# Patient Record
Sex: Male | Born: 1960 | Race: White | Hispanic: No | Marital: Married | Smoking: Former smoker
Health system: Southern US, Community
[De-identification: ages and names within clinical notes are randomized; demographics above are authoritative.]

## PROBLEM LIST (undated history)

## (undated) DIAGNOSIS — L409 Psoriasis, unspecified: Secondary | ICD-10-CM

## (undated) DIAGNOSIS — M479 Spondylosis, unspecified: Secondary | ICD-10-CM

## (undated) DIAGNOSIS — I10 Essential (primary) hypertension: Secondary | ICD-10-CM

## (undated) DIAGNOSIS — M4326 Fusion of spine, lumbar region: Secondary | ICD-10-CM

## (undated) DIAGNOSIS — R7303 Prediabetes: Secondary | ICD-10-CM

## (undated) DIAGNOSIS — L0390 Cellulitis, unspecified: Secondary | ICD-10-CM

## (undated) DIAGNOSIS — G4730 Sleep apnea, unspecified: Secondary | ICD-10-CM

## (undated) DIAGNOSIS — K589 Irritable bowel syndrome without diarrhea: Secondary | ICD-10-CM

## (undated) HISTORY — DX: Fusion of spine, lumbar region: M43.26

## (undated) HISTORY — DX: Psoriasis, unspecified: L40.9

## (undated) HISTORY — DX: Sleep apnea, unspecified: G47.30

## (undated) HISTORY — PX: VASECTOMY: SHX75

## (undated) HISTORY — DX: Cellulitis, unspecified: L03.90

## (undated) HISTORY — DX: Irritable bowel syndrome, unspecified: K58.9

## (undated) HISTORY — DX: Prediabetes: R73.03

## (undated) HISTORY — DX: Essential (primary) hypertension: I10

## (undated) HISTORY — DX: Spondylosis, unspecified: M47.9

---

## 1993-09-09 HISTORY — PX: APPENDECTOMY: SHX54

## 2001-12-17 ENCOUNTER — Ambulatory Visit (HOSPITAL_COMMUNITY): Admission: RE | Admit: 2001-12-17 | Discharge: 2001-12-17 | Payer: Self-pay | Admitting: Gastroenterology

## 2001-12-17 ENCOUNTER — Encounter (INDEPENDENT_AMBULATORY_CARE_PROVIDER_SITE_OTHER): Payer: Self-pay | Admitting: Specialist

## 2006-07-04 ENCOUNTER — Ambulatory Visit (HOSPITAL_COMMUNITY): Admission: RE | Admit: 2006-07-04 | Discharge: 2006-07-04 | Payer: Self-pay | Admitting: Neurosurgery

## 2006-07-10 ENCOUNTER — Encounter: Admission: RE | Admit: 2006-07-10 | Discharge: 2006-07-10 | Payer: Self-pay | Admitting: Neurosurgery

## 2006-11-10 ENCOUNTER — Encounter: Admission: RE | Admit: 2006-11-10 | Discharge: 2006-11-10 | Payer: Self-pay | Admitting: Neurosurgery

## 2006-11-11 ENCOUNTER — Ambulatory Visit: Payer: Self-pay | Admitting: Internal Medicine

## 2006-12-12 ENCOUNTER — Ambulatory Visit (HOSPITAL_BASED_OUTPATIENT_CLINIC_OR_DEPARTMENT_OTHER): Admission: RE | Admit: 2006-12-12 | Discharge: 2006-12-12 | Payer: Self-pay | Admitting: Internal Medicine

## 2006-12-21 ENCOUNTER — Ambulatory Visit: Payer: Self-pay | Admitting: Internal Medicine

## 2007-01-02 ENCOUNTER — Ambulatory Visit: Payer: Self-pay | Admitting: Internal Medicine

## 2007-03-20 ENCOUNTER — Ambulatory Visit: Payer: Self-pay | Admitting: Internal Medicine

## 2007-07-24 ENCOUNTER — Ambulatory Visit: Payer: Self-pay | Admitting: Internal Medicine

## 2007-10-09 ENCOUNTER — Encounter: Admission: RE | Admit: 2007-10-09 | Discharge: 2007-10-09 | Payer: Self-pay | Admitting: Neurosurgery

## 2008-07-21 DIAGNOSIS — G4733 Obstructive sleep apnea (adult) (pediatric): Secondary | ICD-10-CM | POA: Insufficient documentation

## 2008-07-21 DIAGNOSIS — E669 Obesity, unspecified: Secondary | ICD-10-CM | POA: Insufficient documentation

## 2008-10-06 ENCOUNTER — Ambulatory Visit: Payer: Self-pay | Admitting: Internal Medicine

## 2009-07-24 ENCOUNTER — Encounter: Payer: Self-pay | Admitting: Internal Medicine

## 2009-07-28 ENCOUNTER — Ambulatory Visit (HOSPITAL_COMMUNITY): Admission: RE | Admit: 2009-07-28 | Discharge: 2009-07-28 | Payer: Self-pay | Admitting: Family Medicine

## 2010-08-30 ENCOUNTER — Encounter: Payer: Self-pay | Admitting: Internal Medicine

## 2010-09-28 ENCOUNTER — Ambulatory Visit
Admission: RE | Admit: 2010-09-28 | Discharge: 2010-09-28 | Payer: Self-pay | Source: Home / Self Care | Attending: Urology | Admitting: Urology

## 2010-10-02 LAB — POCT I-STAT, CHEM 8
BUN: 17 mg/dL (ref 6–23)
Calcium, Ion: 1.15 mmol/L (ref 1.12–1.32)
Chloride: 103 mEq/L (ref 96–112)
Creatinine, Ser: 1.2 mg/dL (ref 0.4–1.5)
TCO2: 31 mmol/L (ref 0–100)

## 2010-10-02 NOTE — Op Note (Signed)
  Bryan Sanders, Bryan Sanders                  ACCOUNT NO.:  [id number]  MEDICAL RECORD NO.:  [id number]          PATIENT TYPE:  AMB  LOCATION:  [facility]                         FACILITY:  [facility]  PHYSICIAN:  Verginia Toohey I. Patsi Sears, M.D.DATE OF BIRTH:  24-Mar-1961  DATE OF PROCEDURE: DATE OF DISCHARGE:                              OPERATIVE REPORT   PREOPERATIVE DIAGNOSIS:  Chronic balanitis.  POSTOPERATIVE DIAGNOSIS:  Chronic balanitis.  OPERATIONS:  Circumcision.  SURGEON:  Esmay Amspacher I. Patsi Sears, M.D.  ANESTHESIA:  General LMA.  PREPARATION:  After appropriate preanesthesia, the patient was brought to the operating room, placed on the operating room in the dorsal supine position where general LMA anesthesia was introduced.  He was then replaced in dorsal lithotomy position where the pubis was prepped with Betadine solution, draped in usual fashion.  HISTORY:  This 50 year old male has a history of balanitis, treated with clotrimazole and betamethasone as well as Keflex.  He has had some resolution of his balanitis, but still has skin stuck to the glans.  He is now here for circumcision and possible biopsy.  DESCRIPTION OF PROCEDURE:  Outline of circumcision was made in the periglandular and pericoronal areas.  A 10 mL of Marcaine 0.25 plain was injected in the base of the penis to give him a penile block. Circumcising incisions were then made in the periglandular and pericoronal areas.  The patient had a significant amount of skin stuck to the glans and this required dissection of the skin off the glans. Following removal of the skin, 4 separate quadrants were then created with 4-0 Monocryl suture.  Each quadrant was then closed with interrupted 4-0 Monocryl sutures.  Sterile dressing was applied.  The patient was awakened and taken recovery room after given IV Toradol.  He is in good condition.     Rafferty Postlewait I. Patsi Sears, M.D.     SIT/MEDQ  D:  09/28/2010  T:  09/28/2010  Job:   [id number]  Electronically Signed by Jethro Bolus M.D. on 10/02/2010 01:43:55 PM

## 2010-10-11 NOTE — Medication Information (Signed)
Summary: CPAP Supplies / [facility]   CPAP Supplies / [facility]   Imported By: Lennie Odor 09/12/2010 14:06:31  _____________________________________________________________________  External Attachment:    Type:   Image     Comment:   External Document

## 2011-01-22 NOTE — Assessment & Plan Note (Signed)
[facility]                             PULMONARY OFFICE NOTE   ZADKIEL, DRAGAN                         MRN:          [id number]  DATE:07/24/2007                            DOB:          Jul 05, 1961    PROBLEM LIST:  1. Obstructive sleep apnea.  2. Exogenous obesity.   HISTORY:  He says he is doing great with CPAP at 11 CWP, best in a long  time.  It also helped him that he got a steroid injection which stopped  his back pain.  Between the two he is sleeping much better.  He notes  some sneeze and rhinorrhea, but not bad.  He is on no routine medicines  and no medication allergy.  We discussed over-the-counter  antihistamines.  We discussed weight loss, sleep hygiene and the  importance of being alert while driving.   OBJECTIVE:  VITAL SIGNS:  Weight is up to 323 pounds.  HEENT:  He is significantly obese, very alert.  There are no pressure  marks on his face.  Mild turbinate edema.   IMPRESSION:  Obstructive sleep apnea is well controlled on CPAP at 11  CWP, but if he continues gaining weight, he will need a pressure  adjustment.  There is a mild nonspecific rhinitis which may be allergic.   PLAN:  1. Weight loss.  2. Over-the-counter antihistamine.  3. Schedule return in one year, earlier p.r.n.  4. Continue CPAP.     [name], MD, [name], FACP  Electronically Signed    CDY/MedQ  DD: 07/25/2007  DT: 07/26/2007  Job #: [id number]   cc:   L. Lupe Carney, M.D.

## 2011-01-22 NOTE — Assessment & Plan Note (Signed)
[facility]                             PULMONARY OFFICE NOTE   Bryan Sanders, Bryan Sanders                         MRN:          [id number]  DATE:03/20/2007                            DOB:          06/15/1961    PULMONARY FOLLOWUP   PROBLEMS:  1. Obstructive sleep apnea.  2. Exogenous obesity.   HISTORY:  Mild sleep apnea was demonstrated in April and he was begun on  CPAP at 11 CWP.  He says with this he sleeps much better with much less  daytime sleepiness.  He only rarely leaves the mask off.  His wife is a  Psychiatrist at [facility] and she keeps an eye on him.   MEDICATIONS:  None, except the CPAP.   OBJECTIVE:  Weight 311 pounds, BP 136/70, pulse 84, room air saturation  95%.  He is obese, alert.  There are no pressure marks on his face from the  mask.  Pulses regular.  Breathing unlabored.   IMPRESSION:  1. Obstructive sleep apnea.  2. Exogenous obesity.   PLAN:  1. Weight loss was again discussed and encouraged.  2. Continue CPAP at 11 CWP.  3. Return in 4 months, earlier p.r.n.     [name], MD, [name], FACP  Electronically Signed    CDY/MedQ  DD: 03/25/2007  DT: 03/26/2007  Job #: [id number]   cc:   L. Lupe Carney, M.D.

## 2011-01-25 NOTE — Assessment & Plan Note (Signed)
[facility]                             PULMONARY OFFICE NOTE   Bryan Sanders, ILLES                         MRN:          [id number]  DATE:11/11/2006                            DOB:          09/03/1961    PROBLEM:  This is a 50 year old man seen at the recommendation of his  wife, a radiology technician at the hospital, with concern about sleep  disordered breathing.   HISTORY:  They describe his snoring as very loud and his sleep quality  as restless. He is having more awareness of daytime sleepiness now in  his work as a Naval architect. He denies feeling that he is at risk for  having an accident but I discussed this bluntly with him emphasizing his  responsibility. Bedtime is between 9:30 and 10:00 p.m. estimating sleep  latency of 15-20 minutes and waking 4-5 times during the night before  getting up at 5 a.m. His legs twitch some but not a lot. Snoring is not  positional. Frequently he drives his wife into a separate bedroom with  his snoring. He does not nap. He takes little caffeine.   MEDICATIONS:  No routine medications.   He has not seen Dr. Clovis Riley apparently in quite a while for primary  care.   REVIEW OF SYSTEMS:  As per HPI. For years, he has had an occasional  sensation that if he was eating fast food would hang up in mid  esophagus. He did have a modified barium swallow which was normal about  4 years ago for this same complaint. Little awareness of any reflux.  Weight has gone up about 20 pounds in the last few years.   PAST MEDICAL HISTORY:  There is no history of any significant medical  problem, in particular neurologic, thyroid, heart or lung. Dr. Trey Sailors  has evaluated for bulging disk at L4-5 and he has had a steroid  injection at that level. No history of ear, nose and throat surgery.  Appendectomy 25 years ago.   SOCIAL HISTORY:  Married to a Psychiatrist at [facility].  They have children. He quit smoking 20  years ago, quit alcohol 20 years  ago, works as a Naval architect.   FAMILY HISTORY:  Diabetes. Nobody known to have sleep disordered  breathing.   OBJECTIVE:  VITAL SIGNS:  Weight 320 pounds, blood pressure 132/92,  pulse 62, room air saturation 98%.  GENERAL:  This is a distinctly obese man, thick neck. He is alert,  palate spacing is 3-4/4.  HEENT:  He is breathing through his nose with mouth closed. Speech  quality is normal. There is no stridor or palpable thyromegaly.  HEART:  Sounds are regular without murmur or gallop.  LUNGS:  Lung fields are clear. No unusual restlessness or movement.  NEUROLOGIC:  Unremarkable to observation.   IMPRESSION:  1. Probable obstructive sleep apnea.  2. Exogenous obesity.   PLAN:  1. We are scheduling a split protocol sleep study at the [facility]      sleep center and he will return after that is  completed.  2. I have emphasized the importance of weight loss and his      responsibility to drive safely and to be alert while driving as      discussed. We have discussed the basics of sleep apnea.     [name], MD, [name], FACP  Electronically Signed    CDY/MedQ  DD: 11/11/2006  DT: 11/12/2006  Job #: [id number]   cc:   L. Lupe Carney, M.D.  [facility]

## 2011-01-25 NOTE — Procedures (Signed)
[place]  Patient:    HATIM, HOMANN Visit Number: [id number] MRN: [id number]          Service Type: END Location: ENDO Attending Physician:  Dennison Bulla Ii Dictated by:   Verlin Grills, M.D. Proc. Date: 12/17/01 Admit Date:  12/17/2001 Discharge Date: 12/17/2001   CC:         Lupe Carney, M.D.   Procedure Report  PROCEDURE:  Colonoscopy and random colonic biopsy.  REFERRING PHYSICIAN:  Lupe Carney, M.D.  PROCEDURE INDICATION:  Mr. Griffon Herberg. Cherne is a 50 year old male born 09-Mar-1961.  Mr. Longest recently underwent a complete physical examination performed by Dr. Lupe Carney.  Mr. Alperin relates a 68-month history of having 4-5 formed bowel movements daily unassociated with fever, weight loss, anorexia, nausea, vomiting or gastrointestinal bleeding.  He will usually have abdominal cramps prior to having bowel movements.  He typically has three bowel movements after his morning meal and a couple of bowel movements after his noon meal.  He was recently started on hyoscyamine with symptomatic improvement.  Mr. Reid stool hemoccult cards submitted to Dr. Lupe Carney were positive.  Mr. Cu works for the [employer] and takes no medication on a regular basis.  MEDICATION ALLERGIES:  None.  CHRONIC MEDICATIONS:  None.  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  HABITS:  Mr. Wiegel smokes one-half pack of cigarettes per day and has smoked for approximately 10 years.  SOCIAL HISTORY:  Mr. Cominsky 66 year old wife works at the [employer] and she is in good health.  His 40 year old daughter and 82 year old son are in good health.  ENDOSCOPIST:  Charolett Bumpers, M.D.  PREMEDICATION:  Demerol 50 mg, Versed 7.5 mg.  ENDOSCOPE:  Olympus pediatric colonoscope.  PROCEDURE:  After obtaining informed consent, Mr. Kardell was placed in the  left lateral decubitus position.  I administered intravenous Demerol and intravenous Versed to achieve conscious sedation for the procedure.  The patients blood pressure, oxygen saturation and cardiac rhythm were monitored throughout the procedure and documented in the medical record.  Anal inspection was normal.  Digital rectal examination was normal.  The Olympus pediatric video colonoscope was introduced into the rectum and easily advanced to the cecum.  Colonic preparation for the examination today was satisfactory.  RECTUM:  Normal.  SIGMOID/DESCENDING COLON:  Normal.  SPLENIC FLEXURE:  Normal.  TRANSVERSE COLON:  Normal.  HEPATIC FLEXURE:  Normal.  ASCENDING COLON:  Normal.  CECUM/ILEOCECAL VALVE:  Normal.  BIOPSIES:  Three biopsies were taken from the right colon and three biopsies were taken from the descending colon to rule out microscopic-collagenous colitis.  ASSESSMENT:  Normal proctocolonoscopy to the cecum.  Random colonic biopsies to rule out microscopic-collagenous colitis pending. Dictated by:   Verlin Grills, M.D. Attending Physician:  Dennison Bulla Ii DD:  12/17/01 TD:  12/17/01 Job: [id number]

## 2011-01-25 NOTE — Assessment & Plan Note (Signed)
Bryan Sanders                             PULMONARY OFFICE NOTE   Bryan Sanders, Bryan Sanders                         MRN:          [id number]  DATE:01/02/2007                            DOB:          October 06, 1960    PROBLEM:  1. Obstructive sleep apnea.  2. Exogenous obesity.   HISTORY:  Primary complaint has been his wife's inability to sleep  because of his snoring. He returns now after sleep study at the [facility] demonstrating mild obstructive apnea with an index of 8 per hour  non positional events, moderate snoring, oxygen desaturation to 80%.  He  also had periodic limb movement with arousal at 8.6 per hour.  I  discussed these issues including the importance of weight loss, his  responsibility to drive safely, conservative intervention such as  sleeping off flat on back and treatment for nasal congestion.  We  reviewed and compared CPAP, surgery and oral appliances.  Considering  these and lack of success with weight loss, he asked to try CPAP.   OBJECTIVE:  Weight 307 pounds. Blood pressure 138/82, pulse regular 70,  room air saturation 96%.  Significantly overweight. Alert. Nasal airway  clear, breathing unlabored. No tremor or restlessness.   IMPRESSION:  1. Obstructive sleep apnea.  2. Exogenous obesity.  3. Periodic limb movement with arousal.   PLAN:  1. Emphasis on weight loss.  2. We are titrating his CPAP and will try that modality first.  3. Sleep hygiene measures were reviewed.  4. Temazepam 30 mg #10 one q.h.s. p.r.n. during initial adjustment to      CPAP.  5. Scheduled return 1 month, earlier p.r.n.     [name], MD, [name], FACP  Electronically Signed    CDY/MedQ  DD: 01/02/2007  DT: 01/02/2007  Job #: [id number]   cc:   L. Lupe Carney, M.D.

## 2011-01-25 NOTE — Procedures (Signed)
NAMEJOZEF, EISENBEIS NO.:  [id number]   MEDICAL RECORD NO.:  [id number]          PATIENT TYPE:  OUT   LOCATION:  SLEEP CENTER                 FACILITY:  [facility]   PHYSICIAN:  [name], MD, FCCP, FACPDATE OF BIRTH:  02-08-62   DATE OF STUDY:  12/12/2006                            NOCTURNAL POLYSOMNOGRAM   REFERRING PHYSICIAN:  Clinton D. Young, MD, FCCP, FACP   INDICATION FOR STUDY:  Hypersomnia with sleep apnea.   EPWORTH SLEEPINESS SCORE:  15/24, BMI 40.3, weight 305 pounds.   MEDICATIONS:  No home medications were listed.   SLEEP ARCHITECTURE:  Total sleep time 378 minutes with sleep efficiency  87%.  Stage I was 5%, stage II 55%, stages III and IV 12%, REM 28% of  total sleep time.  Sleep latency 20 minutes, REM latency 125 minutes,  awake after sleep onset 38 minutes, arousal index 28.2.  No bedtime  medication was taken.   RESPIRATORY DATA:  Apnea-hypopnea index (AHI, RDI) 7.9 obstructive  events per hour, indicating mild obstructive sleep apnea/hypopnea  syndrome.  This included one obstructive apnea and 49 hypopneas.  Events  were not positional.  REM AHI 26.6 per hour.   OXYGEN DATA:  Moderate continuous snoring with oxygen desaturation to a  nadir of 82%.  Mean oxygen saturation through the study was 93% on room  air.   CARDIAC DATA:  Sinus rhythm.   MOVEMENT-PARASOMNIA:  A total of 420 limb jerks were recorded, of which  54 were associated with arousal or awakening, for a periodic limb  movement with arousal index of 8.6 per hour which is increased.   IMPRESSIONS-RECOMMENDATIONS:  1. Mild obstructive sleep apnea/hypopnea syndrome, apnea-hypopnea      index 7.9 per hour, with nonpositional events, moderate snoring and      oxygen desaturation to a nadir of 80%.  2. Events in this range would be marginal for consideration of      continuous positive airway pressure therapy if more conservative      measures are insufficient.  Emphasize  weight loss and evaluate for      upper airway obstruction/nasal congestion.  3. Periodic limb movement syndrome with arousal, 8.6 per hour.      Consider a trial of specific therapy such as ReQuip or Mirapex of      clinically appropriate.      [name], MD, [facility], FACP  Diplomate, Biomedical engineer of Sleep Medicine  Electronically Signed     CDY/MEDQ  D:  12/21/2006 09:06:14  T:  12/21/2006 09:47:14  Job:  [id number]

## 2012-11-19 ENCOUNTER — Other Ambulatory Visit: Payer: Self-pay | Admitting: Gastroenterology

## 2012-12-10 ENCOUNTER — Other Ambulatory Visit: Payer: Self-pay | Admitting: Gastroenterology

## 2012-12-10 DIAGNOSIS — D126 Benign neoplasm of colon, unspecified: Secondary | ICD-10-CM

## 2012-12-18 ENCOUNTER — Other Ambulatory Visit (HOSPITAL_COMMUNITY): Payer: Self-pay | Admitting: Gastroenterology

## 2012-12-18 DIAGNOSIS — D126 Benign neoplasm of colon, unspecified: Secondary | ICD-10-CM

## 2012-12-18 DIAGNOSIS — K635 Polyp of colon: Secondary | ICD-10-CM

## 2012-12-30 ENCOUNTER — Other Ambulatory Visit (HOSPITAL_COMMUNITY): Payer: Self-pay

## 2012-12-30 ENCOUNTER — Other Ambulatory Visit: Payer: Self-pay

## 2013-01-01 ENCOUNTER — Other Ambulatory Visit (HOSPITAL_COMMUNITY): Payer: Self-pay

## 2013-01-01 ENCOUNTER — Other Ambulatory Visit: Payer: Self-pay

## 2013-01-08 ENCOUNTER — Ambulatory Visit (HOSPITAL_COMMUNITY)
Admission: RE | Admit: 2013-01-08 | Discharge: 2013-01-08 | Disposition: A | Discharge status: Home / Self Care | Payer: 59 | Source: Ambulatory Visit | Attending: Gastroenterology | Admitting: Gastroenterology

## 2013-01-08 DIAGNOSIS — D126 Benign neoplasm of colon, unspecified: Secondary | ICD-10-CM | POA: Insufficient documentation

## 2013-01-19 ENCOUNTER — Other Ambulatory Visit: Payer: Self-pay | Admitting: Gastroenterology

## 2013-01-19 DIAGNOSIS — Z86010 Personal history of colonic polyps: Secondary | ICD-10-CM

## 2013-01-26 ENCOUNTER — Other Ambulatory Visit: Payer: 59

## 2014-02-11 ENCOUNTER — Ambulatory Visit (INDEPENDENT_AMBULATORY_CARE_PROVIDER_SITE_OTHER): Payer: 59 | Admitting: Internal Medicine

## 2014-02-11 ENCOUNTER — Encounter (INDEPENDENT_AMBULATORY_CARE_PROVIDER_SITE_OTHER): Payer: Self-pay

## 2014-02-11 ENCOUNTER — Encounter: Payer: Self-pay | Admitting: Internal Medicine

## 2014-02-11 VITALS — BP 124/72 | HR 66 | Ht 72.0 in | Wt 340.8 lb

## 2014-02-11 DIAGNOSIS — E669 Obesity, unspecified: Secondary | ICD-10-CM

## 2014-02-11 DIAGNOSIS — G4733 Obstructive sleep apnea (adult) (pediatric): Secondary | ICD-10-CM

## 2014-02-11 NOTE — Progress Notes (Signed)
02/11/14- 41 yoM former smoker Self referral. Pt wants to re-establish with [facility] for new CPAP. Pt was seen by [facility] in 2010. Pt states he is wearing CPAP 11/ [facility] nightly for about 7 hours.  NPSG 12/12/06- Mild OSA, AHI 7.9/hr, PLMA 8.6/hr, weight was 305 lbs. He has been using CPAP 11, describing good compliance and control. CPAP has been a big help for sleep quality. Now needs replacement mask and supplies. Bedtime 9:30 or 10 PM, sleep latency one or 2 hours, waking once before up at 4:30 AM. 5 pound weight gain in the last 2 years. No ENT surgery. Denies cardiopulmonary problems but treated hypertension. History of GERD with esophageal dilatation.  Prior to Admission medications   Medication Sig Start Date End Date Taking? Authorizing Provider  glucose blood test strip 1 each by Other route. 4 times a week   Yes Historical Provider, MD  lisinopril (PRINIVIL,ZESTRIL) 40 MG tablet Take 40 mg by mouth daily.   Yes Historical Provider, MD   Past Medical History  Diagnosis Date  . Hypertension   . Sleep apnea   . Pre-diabetes    Past Surgical History  Procedure Laterality Date  . Appendectomy  1995   Family History  Problem Relation Age of Onset  . Diabetes Mother    History   Social History  . Marital Status: Married    Spouse Name: N/A    Number of Children: N/A  . Years of Education: N/A   Occupational History  . [place] History Main Topics  . Smoking status: Former Smoker -- 1.50 packs/day for 5 years    Types: Cigarettes    Quit date: 09/10/1983  . Smokeless tobacco: Never Used  . Alcohol Use: No  . Drug Use: No  . Sexual Activity: Not on file   Other Topics Concern  . Not on file   Social History Narrative  . No narrative on file   ROS-see HPI Constitutional:   No-   weight loss, night sweats, fevers, chills, fatigue, lassitude. HEENT:   No-  headaches, difficulty swallowing, tooth/dental problems, sore throat,       No-   sneezing, itching, ear ache, nasal congestion, post nasal drip,  CV:  No-   chest pain, orthopnea, PND, swelling in lower extremities, anasarca,                                  dizziness, palpitations Resp: No-   shortness of breath with exertion or at rest.              No-   productive cough,  No non-productive cough,  No- coughing up of blood.              No-   change in color of mucus.  No- wheezing.   Skin: No-   rash or lesions. GI:  No-   heartburn, indigestion, abdominal pain, nausea, vomiting, diarrhea,                 change in bowel habits, loss of appetite  GU: No-   dysuria, change in color of urine, no urgency or frequency.  No- flank pain. MS:  No-   joint pain or swelling.  No- decreased range of motion.  No- back pain. Neuro-     nothing unusual Psych:  No- change in mood or affect. No depression or anxiety.  No memory loss.  OBJ- Physical Exam General- Alert, Oriented, Affect-appropriate, Distress- none acute, + obese-up 35 pounds since                              sleep study Skin- rash-none, lesions- none, excoriation- none Lymphadenopathy- none Head- atraumatic, +thick neck            Eyes- Gross vision intact, PERRLA, conjunctivae and secretions clear            Ears- Hearing, canals-normal            Nose- Clear, no-Septal dev, mucus, polyps, erosion, perforation             Throat- Mallampati III , mucosa clear , drainage- none, tonsils- atrophic Neck- flexible , trachea midline, no stridor , thyroid nl, carotid no bruit Chest - symmetrical excursion , unlabored           Heart/CV- RRR , no murmur , no gallop  , no rub, nl s1 s2                           - JVD- none , edema- none, stasis changes- none, varices- none           Lung- clear to P&A, wheeze- none, cough- none , dullness-none, rub- none           Chest wall-  Abd- tender-no, distended-no, bowel sounds-present, HSM- no Br/ Gen/ Rectal- Not done, not indicated Extrem- cyanosis- none, clubbing, none,  atrophy- none, strength- nl Neuro- grossly intact to observation

## 2014-02-11 NOTE — Patient Instructions (Signed)
Order- [facility]- autotitrate CPAP 8-20 cwp x 7 days for pressure compliance. Replacement mask of choice, supplies, humidifier and check machine to see if it still works adequately.    Dx OSA  Please call as needed

## 2014-03-09 IMAGING — CR DG BE W/ AIR HIGH DENSITY
2 series · 2 of 2 positions shown · non-contrast
Comparison: No priors.

CLINICAL DATA: 51-year-old male.  Recent incomplete colonoscopy.
Benign polyps removed from right side of colon.

AIR CONTRAST BARIUM ENEMA
TECHNIQUE: Initial scout AP supine abdominal image obtained to
insure adequate colon cleansing.  Barium was introduced into the
colon in a retrograde fashion and refluxed from the rectum to the
distal transverse colon.  As much of the barium as possible was
then removed through the indwelling tube via gravity drain.  Air
was then insufflated into the colon.  Spot images of the colon
followed by overhead radiographs were obtained.
Fluoroscopy time:  12 minutes and 3 seconds

[view not recorded (1 of 2)]
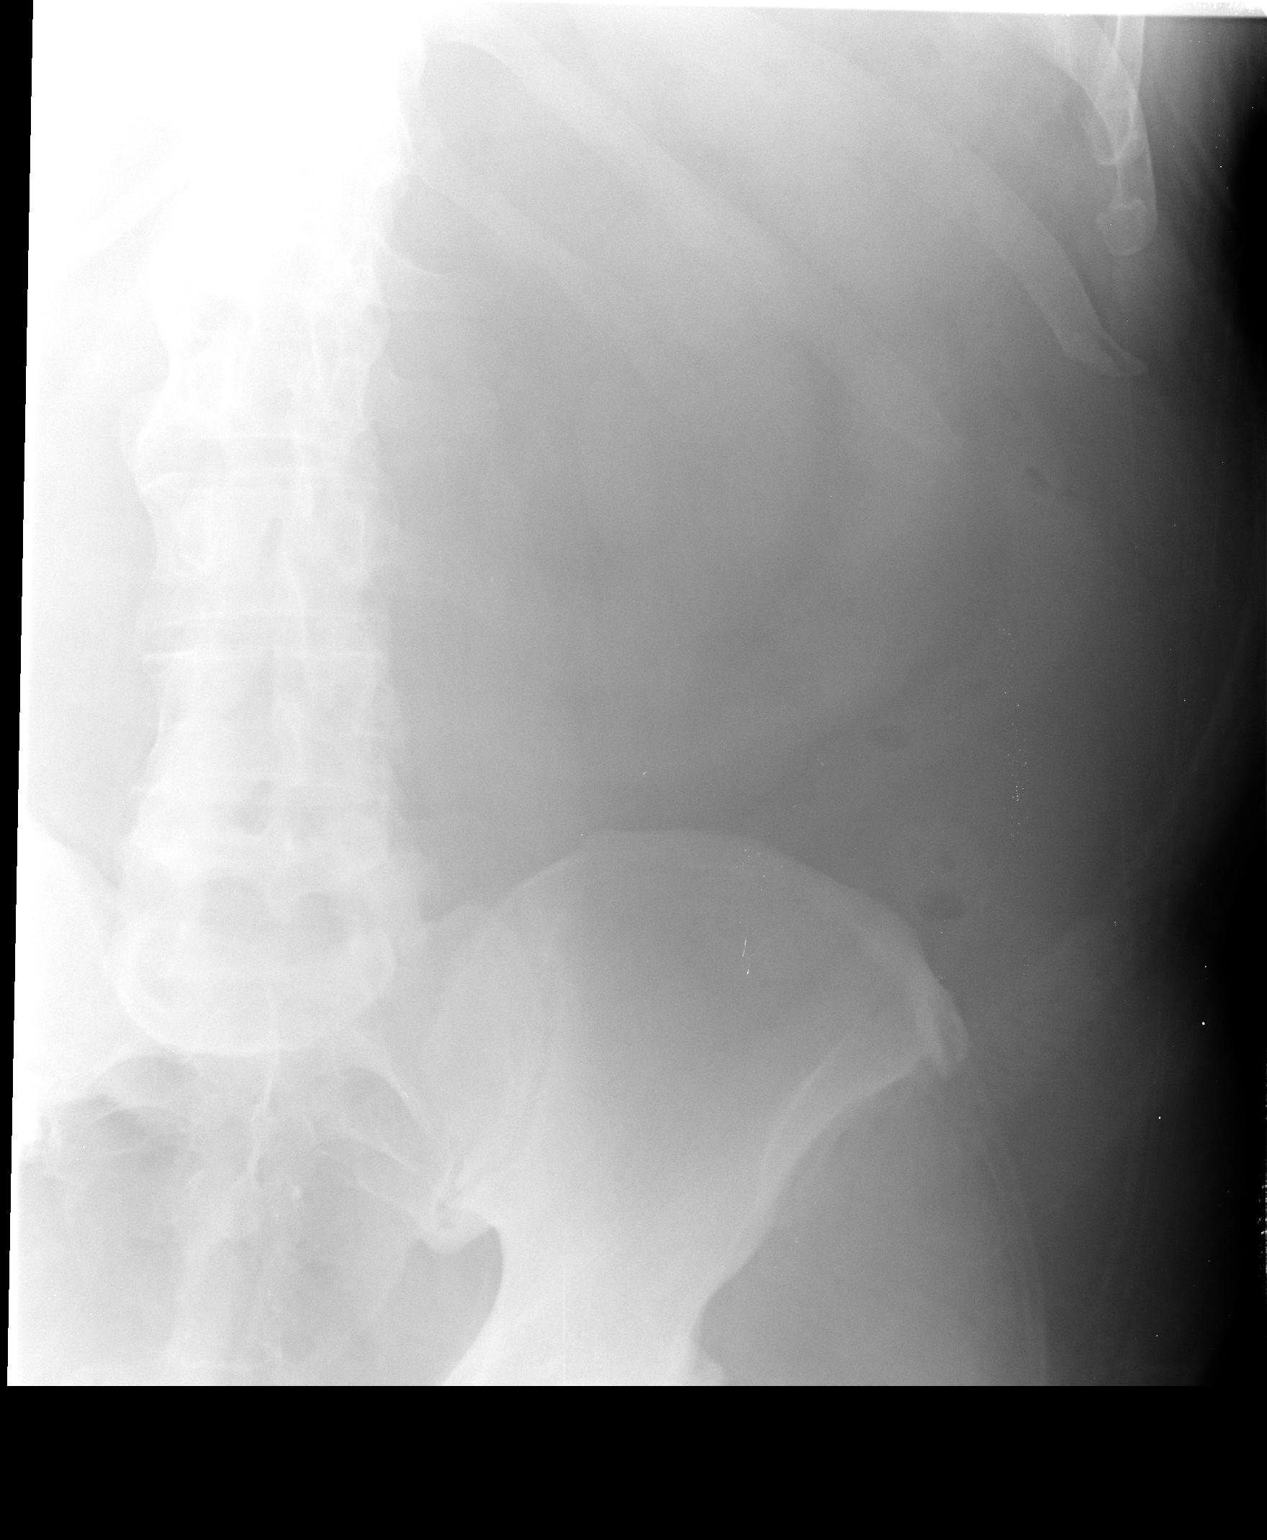

[view not recorded (2 of 2)]
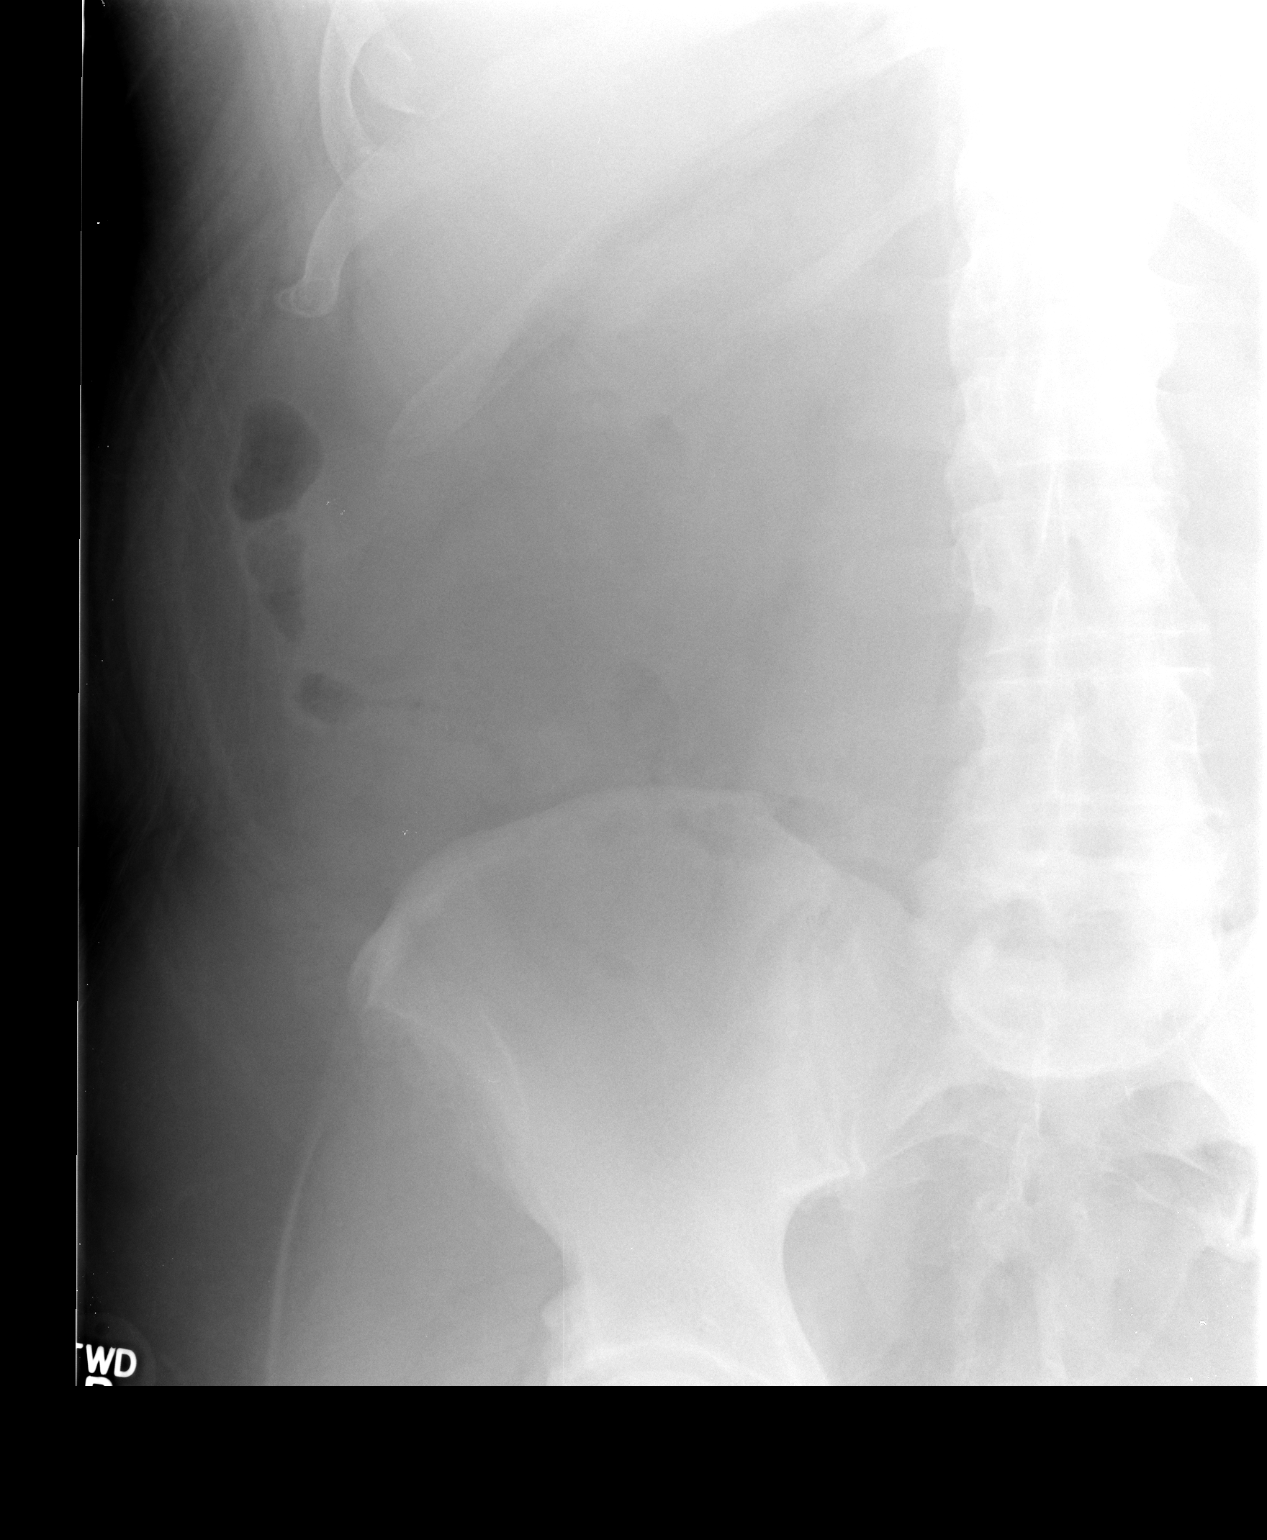

[2 of 2 positions shown; findings below may reference images not displayed]

FINDINGS: Double contrast barium enema demonstrates no definite
colonic mass, polyps, or significant diverticular disease.  No
colonic stricture.  Transient spasms were noted from the hepatic
flexure to the level of the rectum, however, is was inconsistent
throughout the examination.
IMPRESSION: 1.  Normal double contrast barium enema, as above.

## 2014-03-25 ENCOUNTER — Telehealth: Payer: Self-pay | Admitting: Internal Medicine

## 2014-03-25 ENCOUNTER — Other Ambulatory Visit: Payer: Self-pay | Admitting: Internal Medicine

## 2014-03-25 DIAGNOSIS — G4733 Obstructive sleep apnea (adult) (pediatric): Secondary | ICD-10-CM

## 2014-03-25 NOTE — Telephone Encounter (Signed)
Records received. Have been given to Dr Annamaria Boots to review.   Please advise Dr Annamaria Boots. Thanks.

## 2014-03-25 NOTE — Telephone Encounter (Signed)
CPAP download shows good enough usage and indicates a good fixed pressure setting would be 13. I have already generated an order to set his CPAP at 13. Order was sent to [facility]

## 2014-03-25 NOTE — Telephone Encounter (Signed)
Spoke with the pt and notified of recs per CDY  Pt verbalized understanding  He requests that we send an order to [facility] for new "nose piece" since his is broken and also needing a new humidifier b/c this doesn't work either  Order was sent to [facility]

## 2014-03-25 NOTE — Telephone Encounter (Signed)
Called and spoke to pt and states he never received the results of his compliance report. Called [facility] and had the download faxed to the triage fax. Pt aware that fax will be given to [facility] to review and we will call back with the results.   Will await fax.

## 2014-04-14 NOTE — Assessment & Plan Note (Addendum)
His actual status is probably worse than at original diagnosis, because of weight gain. He has been compliant with CPAP and has clearly benefited from it. Plan-continue CPAP with renewal for supplies, auto titrate for pressure check and get the machine serviced

## 2014-04-14 NOTE — Assessment & Plan Note (Signed)
Counseled noting weight gain since sleep study

## 2014-11-08 ENCOUNTER — Telehealth: Payer: Self-pay | Admitting: Internal Medicine

## 2014-11-08 DIAGNOSIS — G4733 Obstructive sleep apnea (adult) (pediatric): Secondary | ICD-10-CM

## 2014-11-08 NOTE — Telephone Encounter (Signed)
Spoke with pt wife, aware that order has been placed for new machine and supplies to DME Nothing further needed.

## 2014-11-08 NOTE — Telephone Encounter (Signed)
Pt wife calling, requesting an order be placed fir a new CPAP machine, pressure setting 13cm Pt wife states that [facility] advised her that they sent an order request to Dr Annamaria Boots for new machine but have not heard back. Please advise Dr Annamaria Boots. Thanks.

## 2014-11-08 NOTE — Telephone Encounter (Signed)
I have not seen any request from [facility].  Ok to order DME replacement CPAP 13, mask of choice, humidifier, supplies,     Dx OSA

## 2014-12-30 ENCOUNTER — Telehealth: Payer: Self-pay | Admitting: Internal Medicine

## 2014-12-30 NOTE — Telephone Encounter (Signed)
Pt wife calling stating that their tax preparer told her in order to get the cost of distilled water for cpap off on their taxes they would need [facility] to write a Rx for it.  Pt wife is asking if an Rx can be written to help cover the monthly cost of the distilled water. Pt goes through 1 gallon of distilled water every 10 days (3 gallons in 30 day period).  Is there any way to word the Rx so that they can be reimbursed for this cost? Simply stating that the patient purchases 3 gallons of distilled water in a months span for medical reasons as his CPAP machine requires this be used instead of tap water.  Please advise Dr Annamaria Boots. Thanks.

## 2014-12-30 NOTE — Telephone Encounter (Signed)
Ok to do thanks. 

## 2014-12-30 NOTE — Telephone Encounter (Signed)
Rx written, given to CY to sign. Pt aware this is being mailed to their home. Nothing further needed.

## 2015-02-10 ENCOUNTER — Encounter: Payer: Self-pay | Admitting: Internal Medicine

## 2015-02-10 ENCOUNTER — Ambulatory Visit (INDEPENDENT_AMBULATORY_CARE_PROVIDER_SITE_OTHER): Payer: 59 | Admitting: Internal Medicine

## 2015-02-10 ENCOUNTER — Ambulatory Visit: Payer: 59 | Admitting: Internal Medicine

## 2015-02-10 VITALS — BP 134/78 | HR 68 | Ht 72.0 in | Wt 334.0 lb

## 2015-02-10 DIAGNOSIS — E669 Obesity, unspecified: Secondary | ICD-10-CM | POA: Diagnosis not present

## 2015-02-10 DIAGNOSIS — G4733 Obstructive sleep apnea (adult) (pediatric): Secondary | ICD-10-CM | POA: Diagnosis not present

## 2015-02-10 NOTE — Patient Instructions (Signed)
We can continue CPAP 13/ [facility]  Glad you are doing so well  Please call as needed

## 2015-02-10 NOTE — Progress Notes (Signed)
02/11/14- 23 yoM former smoker Self referral. Pt wants to re-establish with CY for new CPAP. Pt was seen by CY in 2010. Pt states he is wearing CPAP 11/ [facility] nightly for about 7 hours.  NPSG 12/12/06- Mild OSA, AHI 7.9/hr, PLMA 8.6/hr, weight was 305 lbs. He has been using CPAP 11, describing good compliance and control. CPAP has been a big help for sleep quality. Now needs replacement mask and supplies. Bedtime 9:30 or 10 PM, sleep latency one or 2 hours, waking once before up at 4:30 AM. 5 pound weight gain in the last 2 years. No ENT surgery. Denies cardiopulmonary problems but treated hypertension. History of GERD with esophageal dilatation.  02/10/15- 3 yoM former smoker Self referral. Pt wants to re-establish with CY for new CPAP. Pt was seen by CY in 2010. Pt states he is wearing CPAP 13/ [facility] nightly   Reports: OSA; feels rested; no problems w/CPAP;  He feels he is doing great with no concerns. We discussed comfort and goals.  ROS-see HPI Constitutional:   No-   weight loss, night sweats, fevers, chills, fatigue, lassitude. HEENT:   No-  headaches, difficulty swallowing, tooth/dental problems, sore throat,       No-  sneezing, itching, ear ache, nasal congestion, post nasal drip,  CV:  No-   chest pain, orthopnea, PND, swelling in lower extremities, anasarca,                                  dizziness, palpitations Resp: No-   shortness of breath with exertion or at rest.              No-   productive cough,  No non-productive cough,  No- coughing up of blood.              No-   change in color of mucus.  No- wheezing.   Skin: No-   rash or lesions. GI:  No-   heartburn, indigestion, abdominal pain, nausea, vomiting,  GU: MS:  No-   joint pain or swelling.  No- decreased range of motion.  No- back pain. Neuro-     nothing unusual Psych:  No- change in mood or affect. No depression or anxiety.  No memory loss.  OBJ- Physical Exam General- Alert, Oriented, Affect-appropriate,  Distress- none acute,           + obese Skin- rash-none, lesions- none, excoriation- none Lymphadenopathy- none Head- atraumatic, +thick neck            Eyes- Gross vision intact, PERRLA, conjunctivae and secretions clear            Ears- Hearing, canals-normal            Nose- Clear, no-Septal dev, mucus, polyps, erosion, perforation             Throat- Mallampati III , mucosa clear , drainage- none, tonsils- atrophic Neck- flexible , trachea midline, no stridor , thyroid nl, carotid no bruit Chest - symmetrical excursion , unlabored           Heart/CV- RRR , no murmur , no gallop  , no rub, nl s1 s2                           - JVD- none , edema- none, stasis changes- none, varices- none  Lung- clear to P&A, wheeze- none, cough- none , dullness-none, rub- none           Chest wall-  Abd-  Br/ Gen/ Rectal- Not done, not indicated Extrem- cyanosis- none, clubbing, none, atrophy- none, strength- nl Neuro- grossly intact to observation

## 2015-02-11 NOTE — Assessment & Plan Note (Signed)
We discussed medical importance of his weight for CPAP control and general health. Bariatric counseling.

## 2015-02-11 NOTE — Assessment & Plan Note (Signed)
Great compliance and control documented by download. Pressure of 13 is appropriate. Weight loss would help significantly as discussed.

## 2015-02-24 ENCOUNTER — Encounter: Payer: Self-pay | Admitting: Internal Medicine

## 2015-09-12 MED FILL — LISINOPRIL 40 MG TABLET: 40 | 90 days supply | Qty: 90 | Fill #0

## 2015-10-10 MED FILL — METFORMIN HCL ER 500 MG TAB: 500 | 90 days supply | Qty: 180 | Fill #0

## 2015-10-20 MED FILL — UREA 20% CREAM: 20 | 30 days supply | Qty: 85 | Fill #2

## 2015-10-27 MED FILL — TRIAMCINOLONE 0.1% CREAM: 0.1 | 30 days supply | Qty: 454 | Fill #0

## 2015-12-15 MED FILL — IBUPROFEN 800 MG TABLET: 800 | 4 days supply | Qty: 16 | Fill #0

## 2015-12-15 MED FILL — AMOXICILLIN 500 MG CAPSULE: 500 | 7 days supply | Qty: 25 | Fill #0

## 2015-12-15 MED FILL — HYDROCODON-APAP 5-325: 5-325 | 3 days supply | Qty: 10 | Fill #0

## 2015-12-25 MED FILL — LISINOPRIL 40 MG TABLET: 40 | 90 days supply | Qty: 90 | Fill #0

## 2016-01-05 MED FILL — CLOBETASOL 0.05% CREAM: 0.05 | 20 days supply | Qty: 60 | Fill #0

## 2016-02-12 MED FILL — METFORMIN HCL ER 500 MG TAB: 500 | 90 days supply | Qty: 180 | Fill #1

## 2016-02-16 ENCOUNTER — Ambulatory Visit: Payer: 59 | Admitting: Internal Medicine

## 2016-04-30 MED FILL — LISINOPRIL 40 MG TABLET: 40 | 90 days supply | Qty: 90 | Fill #0

## 2016-06-28 ENCOUNTER — Ambulatory Visit: Payer: 59 | Admitting: Internal Medicine

## 2016-08-05 MED FILL — LISINOPRIL 40 MG TABLET: 40 | 30 days supply | Qty: 30 | Fill #1

## 2016-08-12 MED FILL — METFORMIN HCL ER 500 MG TAB: 500 | 30 days supply | Qty: 60 | Fill #0

## 2016-08-16 MED FILL — FLUTICASONE PROP 0.05% CRM: 0.05 | 30 days supply | Qty: 60 | Fill #0

## 2016-08-16 MED FILL — TRIAMCINOLONE 0.1% CREAM: 0.1 | 30 days supply | Qty: 454 | Fill #0

## 2016-09-19 MED FILL — LISINOPRIL 40 MG TABLET: 40 | 30 days supply | Qty: 30 | Fill #2

## 2016-11-22 MED FILL — GLIMEPIRIDE 1 MG TABLET: 1 | 30 days supply | Qty: 30 | Fill #0

## 2016-11-25 MED FILL — LISINOPRIL 40 MG TABLET: 40 | 30 days supply | Qty: 30 | Fill #3

## 2016-12-23 MED FILL — GLIMEPIRIDE 1 MG TABLET: 1 | 30 days supply | Qty: 30 | Fill #1

## 2016-12-23 MED FILL — LISINOPRIL 40 MG TABLET: 40 | 30 days supply | Qty: 30 | Fill #0

## 2017-01-27 MED FILL — GLIMEPIRIDE 1 MG TABLET: 1 | 30 days supply | Qty: 30 | Fill #2

## 2017-01-27 MED FILL — LISINOPRIL 40 MG TAB: 40 | 30 days supply | Qty: 30 | Fill #1

## 2017-02-14 DIAGNOSIS — I10 Essential (primary) hypertension: Secondary | ICD-10-CM | POA: Diagnosis not present

## 2017-02-14 DIAGNOSIS — E119 Type 2 diabetes mellitus without complications: Secondary | ICD-10-CM | POA: Diagnosis not present

## 2017-02-14 DIAGNOSIS — E7800 Pure hypercholesterolemia, unspecified: Secondary | ICD-10-CM | POA: Diagnosis not present

## 2017-02-26 MED FILL — GLIMEPIRIDE 1 MG TABLET: 1 | 30 days supply | Qty: 30 | Fill #3

## 2017-02-26 MED FILL — LISINOPRIL 40 MG TAB: 40 | 30 days supply | Qty: 30 | Fill #2

## 2017-03-21 DIAGNOSIS — L400 Psoriasis vulgaris: Secondary | ICD-10-CM | POA: Diagnosis not present

## 2017-03-21 MED FILL — KETOCONAZOLE 2% CREAM: 2 | 30 days supply | Qty: 60 | Fill #0

## 2017-03-21 MED FILL — MOMETASONE FUROATE 0.1% CRM: 0.1 | 30 days supply | Qty: 45 | Fill #0

## 2017-03-31 DIAGNOSIS — G4733 Obstructive sleep apnea (adult) (pediatric): Secondary | ICD-10-CM | POA: Diagnosis not present

## 2017-03-31 MED FILL — LISINOPRIL 40 MG TAB: 40 | 30 days supply | Qty: 30 | Fill #3

## 2017-03-31 MED FILL — GLIMEPIRIDE 1 MG TABLET: 1 | 30 days supply | Qty: 30 | Fill #4

## 2017-05-07 MED FILL — GLIMEPIRIDE 1 MG TABLET: 1 | 30 days supply | Qty: 30 | Fill #5

## 2017-05-07 MED FILL — LISINOPRIL 40 MG TAB: 40 | 30 days supply | Qty: 30 | Fill #4

## 2017-06-02 MED FILL — LISINOPRIL 40 MG TABLET: 40 | 30 days supply | Qty: 30 | Fill #5

## 2017-06-02 MED FILL — GLIMEPIRIDE 1 MG TABLET: 1 | 30 days supply | Qty: 30 | Fill #0

## 2017-07-07 MED FILL — GLIMEPIRIDE 1 MG TABLET: 1 | 30 days supply | Qty: 30 | Fill #1

## 2017-07-07 MED FILL — LISINOPRIL 40 MG TABLET: 40 | 30 days supply | Qty: 30 | Fill #6

## 2017-07-23 MED FILL — ONE TOUCH ULTRA TEST STRIPS: 25 days supply | Qty: 25 | Fill #0

## 2017-07-23 MED FILL — ONE TOUCH DELICA 33G LANCET: 31 days supply | Qty: 100 | Fill #0

## 2017-07-23 MED FILL — ONE TOUCH ULTRA 2 GLUCOSE S: W/DEVICE | 1 days supply | Qty: 1 | Fill #0

## 2017-08-11 MED FILL — GLIMEPIRIDE 1 MG TABLET: 1 | 30 days supply | Qty: 30 | Fill #2

## 2017-08-11 MED FILL — LISINOPRIL 40 MG TABLET: 40 | 30 days supply | Qty: 30 | Fill #7

## 2017-08-22 DIAGNOSIS — Z0000 Encounter for general adult medical examination without abnormal findings: Secondary | ICD-10-CM | POA: Diagnosis not present

## 2017-08-22 DIAGNOSIS — Z125 Encounter for screening for malignant neoplasm of prostate: Secondary | ICD-10-CM | POA: Diagnosis not present

## 2017-08-22 DIAGNOSIS — E119 Type 2 diabetes mellitus without complications: Secondary | ICD-10-CM | POA: Diagnosis not present

## 2017-08-22 DIAGNOSIS — E7800 Pure hypercholesterolemia, unspecified: Secondary | ICD-10-CM | POA: Diagnosis not present

## 2017-08-22 DIAGNOSIS — I10 Essential (primary) hypertension: Secondary | ICD-10-CM | POA: Diagnosis not present

## 2017-09-03 ENCOUNTER — Other Ambulatory Visit: Payer: Self-pay | Admitting: Family Medicine

## 2017-09-03 DIAGNOSIS — R0600 Dyspnea, unspecified: Secondary | ICD-10-CM | POA: Diagnosis not present

## 2017-09-03 DIAGNOSIS — M79605 Pain in left leg: Secondary | ICD-10-CM

## 2017-09-03 MED FILL — AMOX-CLAV 875-125 MG TABLET: 875-125 | 10 days supply | Qty: 20 | Fill #0

## 2017-09-04 ENCOUNTER — Ambulatory Visit
Admission: RE | Admit: 2017-09-04 | Discharge: 2017-09-04 | Disposition: A | Discharge status: Home / Self Care | Payer: 59 | Source: Ambulatory Visit | Attending: Family Medicine | Admitting: Family Medicine

## 2017-09-04 DIAGNOSIS — R600 Localized edema: Secondary | ICD-10-CM | POA: Diagnosis not present

## 2017-09-04 DIAGNOSIS — M79605 Pain in left leg: Secondary | ICD-10-CM

## 2017-09-08 MED FILL — DOXYCYCLINE HYCLATE 100 MG: 100 | 10 days supply | Qty: 20 | Fill #0

## 2017-09-10 MED FILL — LISINOPRIL 40 MG TABLET: 40 | 30 days supply | Qty: 30 | Fill #0

## 2017-09-10 MED FILL — GLIMEPIRIDE 1 MG TABLET: 1 | 30 days supply | Qty: 30 | Fill #3

## 2017-10-02 DIAGNOSIS — J069 Acute upper respiratory infection, unspecified: Secondary | ICD-10-CM | POA: Diagnosis not present

## 2017-10-02 MED FILL — VENTOLIN HFA 90 MCG INHALER: 108 (90 BAS | 17 days supply | Qty: 18 | Fill #0

## 2017-10-13 MED FILL — LISINOPRIL 40 MG TABLET: 40 | 30 days supply | Qty: 30 | Fill #1

## 2017-10-13 MED FILL — GLIMEPIRIDE 1 MG TABLET: 1 | 30 days supply | Qty: 30 | Fill #4

## 2017-10-13 MED FILL — DOXYCYCLINE HYCLATE 100 MG: 100 | 10 days supply | Qty: 20 | Fill #0

## 2017-11-10 MED FILL — GLIMEPIRIDE 1 MG TABLET: 1 | 30 days supply | Qty: 30 | Fill #5

## 2017-11-10 MED FILL — TRIAMCINOLONE ACETONIDE 0.1: 0.1 | 25 days supply | Qty: 454 | Fill #0

## 2017-11-10 MED FILL — LISINOPRIL 40 MG TABLET: 40 | 30 days supply | Qty: 30 | Fill #2

## 2017-12-15 MED FILL — GLIMEPIRIDE 1 MG TABLET: 1 | 30 days supply | Qty: 30 | Fill #6

## 2017-12-15 MED FILL — LISINOPRIL 40 MG TABLET: 40 | 30 days supply | Qty: 30 | Fill #3

## 2018-01-16 DIAGNOSIS — E119 Type 2 diabetes mellitus without complications: Secondary | ICD-10-CM | POA: Diagnosis not present

## 2018-01-19 MED FILL — LISINOPRIL 40 MG TABLET: 40 | 30 days supply | Qty: 30 | Fill #4

## 2018-01-19 MED FILL — GLIMEPIRIDE 1 MG TABLET: 1 | 30 days supply | Qty: 30 | Fill #7

## 2018-02-20 DIAGNOSIS — E7800 Pure hypercholesterolemia, unspecified: Secondary | ICD-10-CM | POA: Diagnosis not present

## 2018-02-20 DIAGNOSIS — E119 Type 2 diabetes mellitus without complications: Secondary | ICD-10-CM | POA: Diagnosis not present

## 2018-02-20 DIAGNOSIS — I10 Essential (primary) hypertension: Secondary | ICD-10-CM | POA: Diagnosis not present

## 2018-02-27 DIAGNOSIS — M1712 Unilateral primary osteoarthritis, left knee: Secondary | ICD-10-CM | POA: Diagnosis not present

## 2018-02-27 MED FILL — MELOXICAM 15 MG TABLET: 15 | 30 days supply | Qty: 30 | Fill #0

## 2018-03-02 MED FILL — GLIMEPIRIDE 1 MG TABLET: 1 | 31 days supply | Qty: 31 | Fill #0

## 2018-03-02 MED FILL — LISINOPRIL 40 MG TABLET: 40 | 30 days supply | Qty: 30 | Fill #0

## 2018-03-26 MED FILL — MELOXICAM 15 MG TABLET: 15 | 30 days supply | Qty: 30 | Fill #1

## 2018-04-01 MED FILL — GLIMEPIRIDE 1 MG TABLET: 1 | 31 days supply | Qty: 31 | Fill #1

## 2018-04-01 MED FILL — LISINOPRIL 40 MG TABLET: 40 | 30 days supply | Qty: 30 | Fill #1

## 2018-04-03 DIAGNOSIS — M1712 Unilateral primary osteoarthritis, left knee: Secondary | ICD-10-CM | POA: Diagnosis not present

## 2018-04-27 MED FILL — LISINOPRIL 40 MG TABLET: 40 | 30 days supply | Qty: 30 | Fill #0

## 2018-04-27 MED FILL — GLIMEPIRIDE 1 MG TABLET: 1 | 30 days supply | Qty: 30 | Fill #0

## 2018-04-27 MED FILL — MELOXICAM 15 MG TABLET: 15 | 30 days supply | Qty: 30 | Fill #0

## 2018-05-07 MED FILL — AMLODIPINE BESYLATE 5 MG TA: 5 | 30 days supply | Qty: 30 | Fill #0

## 2018-06-01 MED FILL — ONE TOUCH ULTRA TEST STRIPS: 25 days supply | Qty: 25 | Fill #1

## 2018-06-01 MED FILL — LISINOPRIL 40 MG TABLET: 40 | 30 days supply | Qty: 30 | Fill #1

## 2018-06-01 MED FILL — AMLODIPINE BESYLATE 5 MG TA: 5 | 30 days supply | Qty: 30 | Fill #1

## 2018-06-01 MED FILL — GLIMEPIRIDE 1 MG TABLET: 1 | 30 days supply | Qty: 30 | Fill #1

## 2018-06-19 DIAGNOSIS — Z23 Encounter for immunization: Secondary | ICD-10-CM | POA: Diagnosis not present

## 2018-06-19 DIAGNOSIS — E1169 Type 2 diabetes mellitus with other specified complication: Secondary | ICD-10-CM | POA: Diagnosis not present

## 2018-06-19 DIAGNOSIS — G4733 Obstructive sleep apnea (adult) (pediatric): Secondary | ICD-10-CM | POA: Diagnosis not present

## 2018-06-19 DIAGNOSIS — I10 Essential (primary) hypertension: Secondary | ICD-10-CM | POA: Diagnosis not present

## 2018-07-03 MED FILL — LISINOPRIL 40 MG TABLET: 40 | 30 days supply | Qty: 30 | Fill #2

## 2018-07-03 MED FILL — GLIMEPIRIDE 1 MG TABLET: 1 | 30 days supply | Qty: 30 | Fill #2

## 2018-07-03 MED FILL — AMLODIPINE BESYLATE 5 MG TA: 5 | 30 days supply | Qty: 30 | Fill #2

## 2018-07-09 DIAGNOSIS — G4733 Obstructive sleep apnea (adult) (pediatric): Secondary | ICD-10-CM | POA: Diagnosis not present

## 2018-07-30 MED FILL — GLIMEPIRIDE 1 MG TABLET: 1 | 30 days supply | Qty: 30 | Fill #3

## 2018-07-30 MED FILL — AMLODIPINE BESYLATE 5 MG TA: 5 | 30 days supply | Qty: 30 | Fill #3

## 2018-07-30 MED FILL — LISINOPRIL 40 MG TABLET: 40 | 30 days supply | Qty: 30 | Fill #3

## 2018-09-25 DIAGNOSIS — I10 Essential (primary) hypertension: Secondary | ICD-10-CM | POA: Diagnosis not present

## 2018-09-25 DIAGNOSIS — E1169 Type 2 diabetes mellitus with other specified complication: Secondary | ICD-10-CM | POA: Diagnosis not present

## 2018-09-25 DIAGNOSIS — E7800 Pure hypercholesterolemia, unspecified: Secondary | ICD-10-CM | POA: Diagnosis not present

## 2018-09-25 DIAGNOSIS — R198 Other specified symptoms and signs involving the digestive system and abdomen: Secondary | ICD-10-CM | POA: Diagnosis not present

## 2018-09-25 DIAGNOSIS — Z0000 Encounter for general adult medical examination without abnormal findings: Secondary | ICD-10-CM | POA: Diagnosis not present

## 2018-10-09 DIAGNOSIS — M1712 Unilateral primary osteoarthritis, left knee: Secondary | ICD-10-CM | POA: Diagnosis not present

## 2018-12-01 MED FILL — LISINOPRIL 40 MG TABLET: 40 | 30 days supply | Qty: 30 | Fill #4 | Status: TO

## 2018-12-10 DIAGNOSIS — R1310 Dysphagia, unspecified: Secondary | ICD-10-CM | POA: Diagnosis not present

## 2018-12-10 DIAGNOSIS — K580 Irritable bowel syndrome with diarrhea: Secondary | ICD-10-CM | POA: Diagnosis not present

## 2018-12-21 MED FILL — ONE TOUCH ULTRA TEST STRIPS: 31 days supply | Qty: 50 | Fill #0

## 2018-12-30 MED FILL — AMLODIPINE BESYLATE 5 MG TA: 5 | 30 days supply | Qty: 30 | Fill #0

## 2018-12-30 MED FILL — GLIMEPIRIDE 1 MG TABLET: 1 | 30 days supply | Qty: 30 | Fill #0

## 2018-12-30 MED FILL — LISINOPRIL 40 MG TABLET: 40 | 30 days supply | Qty: 30 | Fill #0

## 2019-01-15 MED FILL — OMEPRAZOLE 20 MG CPDR: 20 | 31 days supply | Qty: 31 | Fill #0

## 2019-01-29 MED FILL — LISINOPRIL 40 MG TABLET: 40 | 30 days supply | Qty: 30 | Fill #1

## 2019-01-29 MED FILL — GLIMEPIRIDE 1 MG TABLET: 1 | 30 days supply | Qty: 30 | Fill #1

## 2019-01-29 MED FILL — AMLODIPINE BESYLATE 5 MG TA: 5 | 30 days supply | Qty: 30 | Fill #1

## 2019-03-01 MED FILL — LISINOPRIL 40 MG TABLET: 40 | 30 days supply | Qty: 30 | Fill #0

## 2019-03-01 MED FILL — OMEPRAZOLE 20 MG CPDR: 20 | 31 days supply | Qty: 31 | Fill #0

## 2019-03-01 MED FILL — AMLODIPINE BESYLATE 5 MG TA: 5 | 30 days supply | Qty: 30 | Fill #0

## 2019-03-01 MED FILL — GLIMEPIRIDE 1 MG TABLET: 1 | 30 days supply | Qty: 30 | Fill #0

## 2019-04-01 MED FILL — GLIMEPIRIDE 1 MG TABLET: 1 | 30 days supply | Qty: 30 | Fill #1

## 2019-04-01 MED FILL — LISINOPRIL 40 MG TABLET: 40 | 30 days supply | Qty: 30 | Fill #1

## 2019-04-01 MED FILL — OMEPRAZOLE 20 MG CPDR: 20 | 31 days supply | Qty: 31 | Fill #1

## 2019-04-01 MED FILL — AMLODIPINE BESYLATE 5 MG TA: 5 | 30 days supply | Qty: 30 | Fill #1

## 2019-04-14 MED FILL — GLIMEPIRIDE 2 MG TABLET: 2 | 30 days supply | Qty: 30 | Fill #0

## 2019-04-15 MED FILL — METOPROLOL SUCCINATE ER 25: 25 | 30 days supply | Qty: 30 | Fill #0

## 2019-05-06 MED FILL — OMEPRAZOLE 20 MG CAPSULE DR: 20 | 31 days supply | Qty: 31 | Fill #2

## 2019-05-06 MED FILL — AMLODIPINE BESYLATE 5 MG TA: 5 | 30 days supply | Qty: 30 | Fill #2

## 2019-05-06 MED FILL — LISINOPRIL 40 MG TABLET: 40 | 30 days supply | Qty: 30 | Fill #0

## 2019-05-07 MED FILL — GLIMEPIRIDE 2 MG TABLET: 2 | 30 days supply | Qty: 30 | Fill #1

## 2019-05-10 MED FILL — METOPROLOL SUCCINATE ER 25: 25 | 30 days supply | Qty: 30 | Fill #1

## 2019-06-02 MED FILL — METOPROLOL SUCCINATE ER 50: 50 | 30 days supply | Qty: 30 | Fill #0

## 2019-06-02 MED FILL — GLIMEPIRIDE 2 MG TABLET: 2 | 30 days supply | Qty: 30 | Fill #2

## 2019-06-02 MED FILL — AMLODIPINE BESYLATE 5 MG TA: 5 | 30 days supply | Qty: 30 | Fill #0

## 2019-06-02 MED FILL — LISINOPRIL 40 MG TABLET: 40 | 30 days supply | Qty: 30 | Fill #1

## 2019-06-18 MED FILL — SPIRONOLACTONE 25 MG TABS: 25 | 30 days supply | Qty: 30 | Fill #0

## 2019-06-25 MED FILL — OMEPRAZOLE 20 MG CAPSULE DR: 20 | 31 days supply | Qty: 31 | Fill #3

## 2019-07-06 MED FILL — AMLODIPINE BESYLATE 5 MG TA: 5 | 30 days supply | Qty: 30 | Fill #1

## 2019-07-06 MED FILL — LISINOPRIL 40 MG TABLET: 40 | 31 days supply | Qty: 31 | Fill #0

## 2019-07-16 MED FILL — METOPROLOL SUCCINATE ER 50: 50 | 30 days supply | Qty: 30 | Fill #1

## 2019-07-16 MED FILL — GLIMEPIRIDE 2 MG TABLET: 2 | 30 days supply | Qty: 30 | Fill #3

## 2019-07-20 MED FILL — SPIRONOLACTONE 25 MG TABLET: 25 | 30 days supply | Qty: 30 | Fill #0

## 2019-07-26 MED FILL — OMEPRAZOLE 20 MG CAPSULE DR: 20 | 31 days supply | Qty: 31 | Fill #4

## 2019-08-19 MED FILL — SPIRONOLACTONE 25 MG TABS: 25 | 30 days supply | Qty: 30 | Fill #1

## 2019-08-19 MED FILL — GLIMEPIRIDE 2 MG TABLET: 2 | 30 days supply | Qty: 30 | Fill #4

## 2019-08-30 MED FILL — OMEPRAZOLE 20 MG CAPSULE DR: 20 | 31 days supply | Qty: 31 | Fill #5

## 2019-09-06 MED FILL — METOPROLOL SUCCINATE ER 50: 50 | 30 days supply | Qty: 30 | Fill #2

## 2019-09-06 MED FILL — LISINOPRIL 40 MG TABLET: 40 | 31 days supply | Qty: 31 | Fill #2

## 2019-09-06 MED FILL — AMLODIPINE BESYLATE 5 MG TA: 5 | 30 days supply | Qty: 30 | Fill #3

## 2019-09-20 MED FILL — GLIMEPIRIDE 2 MG TABLET: 2 | 30 days supply | Qty: 30 | Fill #5

## 2019-09-20 MED FILL — spIRONOLACTONE 25 MG TABS: 25 | 30 days supply | Qty: 30 | Fill #2

## 2019-09-30 MED FILL — AMLODIPINE BESYLATE 5 MG TA: 5 | 30 days supply | Qty: 30 | Fill #4

## 2019-09-30 MED FILL — OMEPRAZOLE DR 20 MG CAPSULE: 20 | 31 days supply | Qty: 31 | Fill #6

## 2019-10-11 MED FILL — LISINOPRIL 40 MG TABLET: 40 | 31 days supply | Qty: 31 | Fill #3

## 2019-10-28 MED FILL — OMEPRAZOLE DR 20 MG CAPSULE: 20 | 31 days supply | Qty: 31 | Fill #7

## 2019-12-17 MED FILL — ONE TOUCH ULTRA TEST STRIPS: 31 days supply | Qty: 50 | Fill #0

## 2020-04-14 MED FILL — AMOXICILLIN 875 MG TABS: 875 | 10 days supply | Qty: 20 | Fill #0

## 2021-08-24 ENCOUNTER — Other Ambulatory Visit (HOSPITAL_COMMUNITY): Payer: Self-pay

## 2021-08-24 MED ORDER — SULFAMETHOXAZOLE-TRIMETHOPRIM 800-160 MG PO TABS
1.0000 | ORAL_TABLET | Freq: Two times a day (BID) | ORAL | 0 refills | Status: DC
  Filled 2021-08-24: qty 20, 10d supply, fill #0

## 2021-10-18 ENCOUNTER — Other Ambulatory Visit (HOSPITAL_COMMUNITY): Payer: Self-pay

## 2021-10-18 MED ORDER — GLIMEPIRIDE 2 MG PO TABS
2.0000 mg | ORAL_TABLET | Freq: Every day | ORAL | 0 refills | Status: DC
  Filled 2021-10-18: qty 30, 30d supply, fill #0

## 2021-10-26 ENCOUNTER — Other Ambulatory Visit (HOSPITAL_COMMUNITY): Payer: Self-pay

## 2021-10-26 MED ORDER — AMLODIPINE BESYLATE 5 MG PO TABS
5.0000 mg | ORAL_TABLET | Freq: Every day | ORAL | 2 refills | Status: AC
  Filled 2021-10-26: qty 30, 30d supply, fill #0

## 2021-10-26 MED ORDER — SPIRONOLACTONE 25 MG PO TABS
25.0000 mg | ORAL_TABLET | Freq: Every day | ORAL | 2 refills | Status: AC
  Filled 2021-10-26: qty 30, 30d supply, fill #0

## 2021-10-26 MED ORDER — GLIMEPIRIDE 2 MG PO TABS
2.0000 mg | ORAL_TABLET | Freq: Every morning | ORAL | 2 refills | Status: DC
  Filled 2021-10-26: qty 30, 30d supply, fill #0

## 2021-10-26 MED ORDER — JARDIANCE 25 MG PO TABS
25.0000 mg | ORAL_TABLET | Freq: Every day | ORAL | 2 refills | Status: AC
  Filled 2021-10-26: qty 30, 30d supply, fill #0

## 2021-10-26 MED ORDER — METOPROLOL SUCCINATE ER 50 MG PO TB24
25.0000 mg | ORAL_TABLET | Freq: Every day | ORAL | 2 refills | Status: AC
  Filled 2021-10-26: qty 15, 30d supply, fill #0

## 2021-10-26 MED ORDER — OMEPRAZOLE 20 MG PO CPDR
20.0000 mg | DELAYED_RELEASE_CAPSULE | Freq: Every day | ORAL | 2 refills | Status: AC
  Filled 2021-10-26: qty 30, 30d supply, fill #0

## 2021-10-26 MED ORDER — LISINOPRIL 40 MG PO TABS
40.0000 mg | ORAL_TABLET | Freq: Every day | ORAL | 2 refills | Status: AC
  Filled 2021-10-26: qty 30, 30d supply, fill #0

## 2021-10-29 ENCOUNTER — Ambulatory Visit (INDEPENDENT_AMBULATORY_CARE_PROVIDER_SITE_OTHER): Payer: 59 | Admitting: Orthopaedic Surgery

## 2021-10-29 ENCOUNTER — Other Ambulatory Visit (HOSPITAL_COMMUNITY): Payer: Self-pay

## 2021-10-29 ENCOUNTER — Other Ambulatory Visit: Payer: Self-pay

## 2021-10-29 ENCOUNTER — Ambulatory Visit: Payer: Self-pay

## 2021-10-29 VITALS — Ht 72.0 in | Wt 313.0 lb

## 2021-10-29 DIAGNOSIS — M79651 Pain in right thigh: Secondary | ICD-10-CM | POA: Diagnosis not present

## 2021-10-29 DIAGNOSIS — M25551 Pain in right hip: Secondary | ICD-10-CM

## 2021-10-29 DIAGNOSIS — M5432 Sciatica, left side: Secondary | ICD-10-CM

## 2021-10-29 MED ORDER — METHOCARBAMOL 750 MG PO TABS
750.0000 mg | ORAL_TABLET | Freq: Three times a day (TID) | ORAL | 1 refills | Status: AC | PRN
  Filled 2021-10-29: qty 60, 20d supply, fill #0
  Filled 2021-12-07: qty 60, 20d supply, fill #1

## 2021-10-29 MED ORDER — MELOXICAM 15 MG PO TABS
15.0000 mg | ORAL_TABLET | Freq: Every day | ORAL | 1 refills | Status: AC
  Filled 2021-10-29: qty 30, 30d supply, fill #0
  Filled 2022-01-13: qty 30, 30d supply, fill #1

## 2021-10-29 NOTE — Progress Notes (Signed)
The patient is a very pleasant 61 year old gentleman I am seeing for the first time.  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  This is caused him to have some pain in his left sciatic region but also his right anterior thigh.  He started this new job about 3 months ago and has been doing a lot more climbing and driving and this is worsened since then.  He denies any significant low back pain but he said his wife even noticed that his posture is not great and he is unable to stand up completely straight.  He denies any groin pain bilaterally.  He is a diabetic but has good control.  He also has high blood pressure.  He is on medications for this.  His BMI is 42.45.  He denies any change in bowel bladder function.  He denies any numbness in his feet or weakness in his legs.  Both hips move smoothly and fluidly.  There is no pain in the groin on either hip but there is some slight stiffness.  He does have anterior thigh pain on the right side and ischial pain on the left side with sciatica on the left side.  I could not get him to extend his spine at all but he does have good forward flexion.  An AP pelvis and lateral the right hip shows well-maintained joint space of the articular weightbearing surface especially superior lateral.  There is an inferior osteophyte off of the acetabulum but no significant irregularities of either hip.  An AP and lateral of the lumbar spine shows bridging osteophytes at multiple levels and loss of disc height at multiple levels.  This is significantly worsened from films from 2007.  There is also loss of lumbar lordosis.  I will start meloxicam as an anti-inflammatory for him and Robaxin as a muscle relaxant.  Given the significant change in his plain film findings over 16 years with significant arthritic findings at multiple levels combined with his clinical signs and symptoms of sciatica on the left and thigh  pain on the right, a MRI of the lumbar spine is warranted to assess for nerve compression.  We will see him back once we have this MRI and see how he is doing overall.

## 2021-10-30 ENCOUNTER — Other Ambulatory Visit (HOSPITAL_COMMUNITY): Payer: Self-pay

## 2021-10-31 NOTE — Addendum Note (Signed)
Addended by: Elvin So L on: 10/31/2021 11:01 AM   Modules accepted: Orders

## 2021-11-16 ENCOUNTER — Ambulatory Visit
Admission: RE | Admit: 2021-11-16 | Discharge: 2021-11-16 | Disposition: A | Discharge status: Home / Self Care | Payer: 59 | Source: Ambulatory Visit | Attending: Orthopaedic Surgery | Admitting: Orthopaedic Surgery

## 2021-11-16 DIAGNOSIS — M79651 Pain in right thigh: Secondary | ICD-10-CM

## 2021-11-22 ENCOUNTER — Other Ambulatory Visit (HOSPITAL_COMMUNITY): Payer: Self-pay

## 2021-11-22 MED ORDER — GLIMEPIRIDE 2 MG PO TABS
2.0000 mg | ORAL_TABLET | Freq: Every day | ORAL | 2 refills | Status: AC
  Filled 2021-11-22: qty 30, 30d supply, fill #0
  Filled 2021-12-30: qty 30, 30d supply, fill #1

## 2021-11-28 ENCOUNTER — Ambulatory Visit: Payer: 59 | Admitting: Orthopaedic Surgery

## 2021-12-06 ENCOUNTER — Ambulatory Visit (INDEPENDENT_AMBULATORY_CARE_PROVIDER_SITE_OTHER): Payer: 59 | Admitting: Orthopaedic Surgery

## 2021-12-06 ENCOUNTER — Encounter: Payer: Self-pay | Admitting: Orthopaedic Surgery

## 2021-12-06 DIAGNOSIS — M456 Ankylosing spondylitis lumbar region: Secondary | ICD-10-CM | POA: Diagnosis not present

## 2021-12-06 NOTE — Progress Notes (Signed)
The patient is a 61 year old gentleman who is coming for follow-up after obtaining MRI of his lumbar spine.  He has significant limitations with range of motion of his lumbar spine and weakness in his back in general.  He was having some pain in his right thigh and his left ischium as well.  Plain films of his hips did not show any significant arthritic findings and he denies any groin pain on his hips.  His plain films of the lumbar spine showed questionable areas of ankylosing so felt was appropriate to obtain an MRI based on his radicular symptoms. ? ?The MRIs reviewed and it does not show any stenosis or neural impingement.  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  There is significant atrophy of the paraspinous muscles. ? ?On exam again today I can move both hips smoothly and fluidly with just some stiffness in general.  He does have weakness in his spine muscles in general based on his exam today as well and just seeing his posture and how he holds himself. ? ?I would like to send him to rheumatology because he certainly needs a rheumatologist to help with the diagnosis of the possible ankylosing spondylitis or other rheumatologic condition.  He is asking about the potential for a referral to neurosurgery but right now that may not be the most appropriate referral since he is not having any stenosis or compressive neuropathy.  That may be needed eventually but right now think the best step would be sending him to rheumatology from here. ?

## 2021-12-07 ENCOUNTER — Other Ambulatory Visit (HOSPITAL_COMMUNITY): Payer: Self-pay

## 2021-12-07 ENCOUNTER — Other Ambulatory Visit: Payer: Self-pay

## 2021-12-07 DIAGNOSIS — M456 Ankylosing spondylitis lumbar region: Secondary | ICD-10-CM

## 2021-12-11 ENCOUNTER — Other Ambulatory Visit (HOSPITAL_COMMUNITY): Payer: Self-pay

## 2021-12-31 ENCOUNTER — Other Ambulatory Visit (HOSPITAL_COMMUNITY): Payer: Self-pay

## 2022-01-14 ENCOUNTER — Other Ambulatory Visit (HOSPITAL_COMMUNITY): Payer: Self-pay

## 2022-02-01 NOTE — Progress Notes (Signed)
Office Visit Note  Patient: Bryan Sanders             Date of Birth: 1960/09/19           MRN: [id number]             PCP: Garlan Fair, MD Referring: Mcarthur Rossetti* Visit Date: 02/15/2022 Occupation: @GUAROCC @  Subjective:  Pain and stiffness in entire spine  History of Present Illness: Bryan Sanders is a 61 y.o. male seen in consultation per request of Dr. Ninfa Linden.  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  About 3 to 4 months later he started having right leg and bilateral gluteal pain.  He went to see Dr. Ninfa Linden in February 2023 who did x-rays of his lumbar spine and hip joints.  After that he ordered MRI of the lumbar spine which was suspicious of spondylitis.  He was prescribed methocarbamol and meloxicam for pain management.  It helped him to some extent.  Patient states that he has morning stiffness lasting for couple of hours.  He has noticed some stiffness in his neck.  According to his wife he is always had curvature in his spine which is gradually getting worse. Patient recalls that he fell down the stairs several years ago and had coccyx fracture.  In 2007 he was evaluated by Dr. Alveta Heimlich for lower back pain at that time x-rays of the lumbar spine showed some degenerative changes and he was diagnosed with disc disease.  He also had MRI of his lumbar spine at the time.  He recalls having cortisone injection to his lumbar spine at the time.  He has off-and-on discomfort in his left knee joint and he notices swelling in his knees and his ankles.  He was diagnosed with psoriasis about 10 years ago.  The psoriasis lesions were mostly on the palmar surface of his hands and his groin region.  He is currently not taking any treatment for psoriasis.  He denies any history of uveitis, Planter fasciitis or Achilles tendinitis.  His daughter also has psoriasis.  He was diagnosed with IBS in the past.   His symptoms improved after being on probiotics.  He states that the colonoscopy was negative for inflammatory bowel disease.  Activities of Daily Living:  Patient reports morning stiffness for 2 hours.   Patient Reports nocturnal pain.  Difficulty dressing/grooming: Denies Difficulty climbing stairs: Denies Difficulty getting out of chair: Denies Difficulty using hands for taps, buttons, cutlery, and/or writing: Reports  Review of Systems  Constitutional:  Negative for fatigue.  HENT:  Negative for mouth dryness.   Eyes:  Positive for dryness.  Respiratory:  Negative for shortness of breath.   Cardiovascular:  Positive for swelling in legs/feet.  Gastrointestinal:  Positive for diarrhea.  Endocrine: Positive for excessive thirst.  Genitourinary:  Negative for difficulty urinating.  Musculoskeletal:  Positive for joint pain, gait problem, joint pain, joint swelling and morning stiffness.  Skin:  Positive for rash. Negative for color change and sensitivity to sunlight.  Allergic/Immunologic: Negative for susceptible to infections.  Neurological:  Positive for dizziness, numbness and weakness.  Hematological:  Negative for bruising/bleeding tendency and swollen glands.  Psychiatric/Behavioral:  Negative for depressed mood and sleep disturbance. The patient is not nervous/anxious.     PMFS History:  Patient Active Problem List   Diagnosis Date Noted   Obesity 07/21/2008   Obstructive  sleep apnea 07/21/2008    Past Medical History:  Diagnosis Date   Ankylosis of lumbar spine    Hypertension    IBS (irritable bowel syndrome)    Pre-diabetes    Psoriasis    Sleep apnea    Spondylosis     Family History  Problem Relation Age of Onset   Diabetes Mother    Heart disease Mother    Diabetes Brother    Past Surgical History:  Procedure Laterality Date   APPENDECTOMY  09/09/1993   VASECTOMY     Social History   Social History Narrative   Not on file    There is no  immunization history on file for this patient.   Objective: Vital Signs: BP (!) 142/67 (BP Location: Right Arm, Patient Position: Sitting, Cuff Size: Normal)   Pulse (!) 54   Resp 16   Ht 5' 10"  (1.778 m)   Wt (!) 311 lb (141.1 kg)   BMI 44.62 kg/m    Physical Exam Vitals and nursing note reviewed.  Constitutional:      Appearance: He is well-developed.  HENT:     Head: Normocephalic and atraumatic.  Eyes:     Conjunctiva/sclera: Conjunctivae normal.     Pupils: Pupils are equal, round, and reactive to light.  Cardiovascular:     Rate and Rhythm: Normal rate and regular rhythm.     Heart sounds: Normal heart sounds.  Pulmonary:     Effort: Pulmonary effort is normal.     Breath sounds: Normal breath sounds.  Abdominal:     General: Bowel sounds are normal.     Palpations: Abdomen is soft.  Musculoskeletal:     Cervical back: Normal range of motion and neck supple.  Skin:    General: Skin is warm and dry.     Capillary Refill: Capillary refill takes less than 2 seconds.     Comments: Dry scales noted on the palmar aspect of both hands.  Neurological:     Mental Status: He is alert and oriented to person, place, and time.  Psychiatric:        Behavior: Behavior normal.      Musculoskeletal Exam: He had limited lateral rotation of his cervical spine and limited flexion and extension.  He had thoracic kyphosis.  There was no tenderness over thoracic spine.  He had limited range of motion of the lumbar spine with discomfort.  He had mild tenderness over SI joints.  Shoulder joints were in good range of motion.  He had contractures of bilateral elbow joints with no synovitis.  Wrist joints, MCPs PIPs and DIPs with good range of motion with no synovitis.  He had limited range of motion of bilateral hip joints with discomfort.  He had limited extension of his knee joints.  There was warmth on palpation of his left knee joint.  There was no tenderness over ankles or MTPs.  There was  no evidence of Achilles tendinitis or planter fasciitis.  CDAI Exam: CDAI Score: -- Patient Global: --; Provider Global: -- Swollen: --; Tender: -- Joint Exam 02/15/2022   No joint exam has been documented for this visit   There is currently no information documented on the homunculus. Go to the Rheumatology activity and complete the homunculus joint exam.  Investigation: No additional findings.  Imaging: No results found.  Recent Labs: Lab Results  Component Value Date   HGB 15.3 09/28/2010   NA 142 09/28/2010   K 3.8 09/28/2010   CL  103 09/28/2010   GLUCOSE 111 (H) 09/28/2010   BUN 17 09/28/2010   CREATININE 1.2 09/28/2010    Speciality Comments: No specialty comments available.  Procedures:  No procedures performed Allergies: Patient has no known allergies.   Assessment / Plan:     Visit Diagnoses: Psoriatic arthritis ([place]) - Referred by Dr. 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  Patient has history of psoriasis for about 10 years.  According to his wife he has had thoracic kyphosis for many years which is progressively getting worse.  Patient complains of neck a stiffness and decreased mobility in his cervical spine.  He was evaluated for lower back pain by Dr. Carloyn Manner in 2007.  At that time he was diagnosed with disc disease.  Syndesmophytes were noted on some of the x-rays which are reviewed from 2007.  He recently started driving truck and started having increased lower back pain.  He was evaluated by Dr. Ninfa Linden in February 2023 and had MRI of his lumbar spine which was consistent with ankylosis of the lumbar spine.  Patient has limited mobility in his thoracic and lumbar spine.  He also had mild SI joint tenderness.  He denies any history of uveitis, Achilles tendinitis or planter fasciitis.  He has been seeing Dr. Lorre Nick for past many years and had cortisone injections to his  left knee joint.  Detailed counseling regarding psoriatic arthritis was provided.  I had a detailed discussion with the patient regarding different treatment options and their side effects.  Indications side effects contraindications of biologic DMARD were discussed.  I gave them a handout to review on Humira.  I will obtain labs today.  Psoriasis-diagnosed about 8 to 10 years ago by a dermatologist.  He used topical agents for couple of years and then stopped using.  He had palmar psoriasis today on my examination.  He also describes psoriasis in the inguinal region.  High risk medication use -in anticipation to start him on immunosuppressive therapy I will obtain following labs today.  Plan: CBC with Differential/Platelet, COMPLETE METABOLIC PANEL WITH GFR, Hepatitis B core antibody, IgM, Hepatitis B surface antigen, Hepatitis C antibody, QuantiFERON-TB Gold Plus, Serum protein electrophoresis with reflex, IgG, IgA, IgM.  Recommended immunization list was placed in the AVS.  Chronic pain of left knee -patient states that he has been under care of Dr. Lorre Nick for several years.  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  He has had cortisone injections.  He had warmth on palpation of his left knee joint.  Right knee joint also had limited range of motion without any warmth or swelling.  Plan: Rheumatoid factor, Cyclic citrul peptide antibody, IgG, ANA  Pain in neck -he had limited range of motion of his cervical spine with lateral rotation flexion and abduction.  Plan: XR Cervical Spine 2 or 3 views.  Degenerative changes with syndesmophytes were noted.  Pain in thoracic spine -he had significant thoracic kyphosis.  Plan: XR Thoracic Spine 2 View.  Multilevel spondylosis and bamboo spine was noted.  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  He was evaluated by Dr. Carloyn Manner many years ago.  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  MRI was consistent with ankylosis of the lumbar spine.  He gives history of pain radiating into bilateral gluteal  region and also to the right thigh.  Chronic SI joint pain -he has chronic SI joint discomfort.  Plan: XR Pelvis 1-2 Views, bilateral SI joint sclerosis, more prominent on the right side with erosive changes in the right SI joint were noted.  Sedimentation rate, HLA-B27 antigen  Essential hypertension-blood pressure was elevated today.  He is on amlodipine, metoprolol, spironolactone and lisinopril.  History of diabetes mellitus-treated with Jardiance, glimepiride  History of IBS-patient states that he had problems with constipation and diarrhea for several years.  He was evaluated by Dr. Harrell Lark in the past and had colonoscopy.  He was given probiotics which helped.  He still have some occasional diarrhea.  History of gastroesophageal reflux (GERD)-he takes omeprazole.  Myalgia -he complains of muscle stiffness.  Plan: CK  Obstructive sleep apnea - Uses CPAP.  Family history of psoriasis- daughter.  According to the patient and his wife, their daughter has extensive psoriasis.  Orders: Orders Placed This Encounter  Procedures   XR Thoracic Spine 2 View   XR Cervical Spine 2 or 3 views   XR Pelvis 1-2 Views   CBC with Differential/Platelet   COMPLETE METABOLIC PANEL WITH GFR   Sedimentation rate   CK   Rheumatoid factor   Cyclic citrul peptide antibody, IgG   ANA   HLA-B27 antigen   Hepatitis B core antibody, IgM   Hepatitis B surface antigen   Hepatitis C antibody   QuantiFERON-TB Gold Plus   Serum protein electrophoresis with reflex   IgG, IgA, IgM   No orders of the defined types were placed in this encounter.   Face-to-face time spent with patient was 60 minutes. Greater than 50% of time was spent in counseling and coordination of care.  Follow-Up Instructions: Return for Psoriatic  arthritis.   Bo Merino, MD  Note - This record has been created using Editor, commissioning.  Chart creation errors have been sought, but may not always  have been located. Such creation errors do not reflect on  the standard of medical care.

## 2022-02-15 ENCOUNTER — Ambulatory Visit (INDEPENDENT_AMBULATORY_CARE_PROVIDER_SITE_OTHER): Payer: 59

## 2022-02-15 ENCOUNTER — Encounter: Payer: Self-pay | Admitting: Rheumatology

## 2022-02-15 ENCOUNTER — Ambulatory Visit: Payer: 59 | Admitting: Rheumatology

## 2022-02-15 VITALS — BP 142/67 | HR 54 | Resp 16 | Ht 70.0 in | Wt 311.0 lb

## 2022-02-15 DIAGNOSIS — M542 Cervicalgia: Secondary | ICD-10-CM

## 2022-02-15 DIAGNOSIS — Z840 Family history of diseases of the skin and subcutaneous tissue: Secondary | ICD-10-CM

## 2022-02-15 DIAGNOSIS — G8929 Other chronic pain: Secondary | ICD-10-CM

## 2022-02-15 DIAGNOSIS — M791 Myalgia, unspecified site: Secondary | ICD-10-CM

## 2022-02-15 DIAGNOSIS — M5441 Lumbago with sciatica, right side: Secondary | ICD-10-CM

## 2022-02-15 DIAGNOSIS — M546 Pain in thoracic spine: Secondary | ICD-10-CM

## 2022-02-15 DIAGNOSIS — I10 Essential (primary) hypertension: Secondary | ICD-10-CM

## 2022-02-15 DIAGNOSIS — L409 Psoriasis, unspecified: Secondary | ICD-10-CM

## 2022-02-15 DIAGNOSIS — Z79899 Other long term (current) drug therapy: Secondary | ICD-10-CM

## 2022-02-15 DIAGNOSIS — Z8719 Personal history of other diseases of the digestive system: Secondary | ICD-10-CM

## 2022-02-15 DIAGNOSIS — L4050 Arthropathic psoriasis, unspecified: Secondary | ICD-10-CM

## 2022-02-15 DIAGNOSIS — G4733 Obstructive sleep apnea (adult) (pediatric): Secondary | ICD-10-CM

## 2022-02-15 DIAGNOSIS — M533 Sacrococcygeal disorders, not elsewhere classified: Secondary | ICD-10-CM

## 2022-02-15 DIAGNOSIS — M25562 Pain in left knee: Secondary | ICD-10-CM | POA: Diagnosis not present

## 2022-02-15 DIAGNOSIS — Z8639 Personal history of other endocrine, nutritional and metabolic disease: Secondary | ICD-10-CM

## 2022-02-15 DIAGNOSIS — M5442 Lumbago with sciatica, left side: Secondary | ICD-10-CM

## 2022-02-15 DIAGNOSIS — M450 Ankylosing spondylitis of multiple sites in spine: Secondary | ICD-10-CM

## 2022-02-15 NOTE — Patient Instructions (Signed)
Vaccines You are taking a medication(s) that can suppress your immune system.  The following immunizations are recommended: Flu annually Covid-19  Td/Tdap (tetanus, diphtheria, pertussis) every 10 years Pneumonia (Prevnar 15 then Pneumovax 23 at least 1 year apart.  Alternatively, can take Prevnar 20 without needing additional dose) Shingrix: 2 doses from 4 weeks to 6 months apart  Please check with your PCP to make sure you are up to date.   Adalimumab Injection What is this medication? ADALIMUMAB (ay da LIM yoo mab) is used to treat rheumatoid and psoriatic arthritis. It is also used to treat ankylosing spondylitis, Crohn's disease, ulcerative colitis, plaque psoriasis, hidradenitis suppurativa, and uveitis. This medicine may be used for other purposes; ask your health care provider or pharmacist if you have questions. COMMON BRAND NAME(S): AMJEVITA, Humira What should I tell my care team before I take this medication? They need to know if you have any of these conditions: cancer diabetes (high blood sugar) having surgery heart disease hepatitis B immune system problems infections, such as tuberculosis (TB) or other bacterial, fungal, or viral infections multiple sclerosis recent or upcoming vaccine an unusual reaction to adalimumab, mannitol, latex, rubber, other medicines, foods, dyes, or preservatives pregnant or trying to get pregnant breast-feeding How should I use this medication? This medicine is for injection under the skin. You will be taught how to prepare and give it. Take it as directed on the prescription label. Keep taking it unless your health care provider tells you to stop. It is important that you put your used needles and syringes in a special sharps container. Do not put them in a trash can. If you do not have a sharps container, call your pharmacist or health care provider to get one. This medicine comes with INSTRUCTIONS FOR USE. Ask your pharmacist for  directions on how to use this medicine. Read the information carefully. Talk to your pharmacist or health care provider if you have questions. A special MedGuide will be given to you by the pharmacist with each prescription and refill. Be sure to read this information carefully each time. Talk to your pediatrician regarding the use of this medicine in children. While this drug may be prescribed for children as young as 2 years for selected conditions, precautions do apply. Overdosage: If you think you have taken too much of this medicine contact a poison control center or emergency room at once. NOTE: This medicine is only for you. Do not share this medicine with others. What if I miss a dose? If you miss a dose, take it as soon as you can. If it is almost time for your next dose, take only that dose. Do not take double or extra doses. It is important not to miss any doses. Talk to your health care provider about what to do if you miss a dose. What may interact with this medication? Do not take this medicine with any of the following medications: abatacept anakinra biologic medicines such as certolizumab, etanercept, golimumab, infliximab live virus vaccines This medicine may also interact with the following medications: cyclosporine theophylline vaccines warfarin This list may not describe all possible interactions. Give your health care provider a list of all the medicines, herbs, non-prescription drugs, or dietary supplements you use. Also tell them if you smoke, drink alcohol, or use illegal drugs. Some items may interact with your medicine. What should I watch for while using this medication? Visit your health care provider for regular checks on your progress. Tell your health  care provider if your symptoms do not start to get better or if they get worse. You will be tested for tuberculosis (TB) before you start this medicine. If your doctor prescribes any medicine for TB, you should start  taking the TB medicine before starting this medicine. Make sure to finish the full course of TB medicine. This medicine may increase your risk of getting an infection. Call your health care provider for advice if you get a fever, chills, sore throat, or other symptoms of a cold or flu. Do not treat yourself. Try to avoid being around people who are sick. Talk to your health care provider about your risk of cancer. You may be more at risk for certain types of cancer if you take this medicine. What side effects may I notice from receiving this medication? Side effects that you should report to your doctor or health care professional as soon as possible: allergic reactions like skin rash, itching or hives, swelling of the face, lips, or tongue changes in vision chest pain dizziness heart failure (trouble breathing; fast, irregular heartbeat; sudden weight gain; swelling of the ankles, feet, hands; unusually weak or tired) infection (fever, chills, cough, sore throat, pain or trouble passing urine) liver injury (dark yellow or Biel urine; general ill feeling or flu-like symptoms; loss of appetite, right upper belly pain; unusually weak or tired, yellowing of the eyes or skin) lump or swollen lymph nodes on the neck, groin, or underarm area muscle weakness pain, tingling, numbness in the hands or feet red, scaly patches or raised bumps on the skin trouble breathing unusual bleeding or bruising unusually weak or tired Side effects that usually do not require medical attention (report to your doctor or health care professional if they continue or are bothersome): headache nausea pain, redness, or irritation at site where injected stuffy or runny nose This list may not describe all possible side effects. Call your doctor for medical advice about side effects. You may report side effects to FDA at 1-800-FDA-1088. Where should I keep my medication? Keep out of the reach of children and pets. Store  in the refrigerator between 2 and 8 degrees C (36 and 46 degrees F). Do not freeze. Keep this medicine in the original packaging until you are ready to take it. Protect from light. Get rid of any unused medicine after the expiration date. This medicine may be stored at room temperature for up to 14 days. Keep this medicine in the original packaging. Protect from light. If it is stored at room temperature, get rid of any unused medicine after 14 days or after it expires, whichever is first. To get rid of medicines that are no longer needed or have expired: Take the medicine to a medicine take-back program. Check with your pharmacy or law enforcement to find a location. If you cannot return the medicine, ask your pharmacist or health care provider how to get rid of this medicine safely. NOTE: This sheet is a summary. It may not cover all possible information. If you have questions about this medicine, talk to your doctor, pharmacist, or health care provider.  2023 Elsevier/Gold Standard (2021-07-27 00:00:00)

## 2022-02-21 ENCOUNTER — Telehealth: Payer: Self-pay | Admitting: Pharmacist

## 2022-02-21 LAB — CBC WITH DIFFERENTIAL/PLATELET
Absolute Monocytes: 469 cells/uL (ref 200–950)
Basophils Absolute: 48 cells/uL (ref 0–200)
Basophils Relative: 0.7 %
Eosinophils Absolute: 269 cells/uL (ref 15–500)
Eosinophils Relative: 3.9 %
HCT: 46.8 % (ref 38.5–50.0)
Hemoglobin: 16.2 g/dL (ref 13.2–17.1)
Lymphs Abs: 1546 cells/uL (ref 850–3900)
MCH: 31.2 pg (ref 27.0–33.0)
MCHC: 34.6 g/dL (ref 32.0–36.0)
MCV: 90.2 fL (ref 80.0–100.0)
MPV: 8.6 fL (ref 7.5–12.5)
Monocytes Relative: 6.8 %
Neutro Abs: 4568 cells/uL (ref 1500–7800)
Neutrophils Relative %: 66.2 %
Platelets: 176 10*3/uL (ref 140–400)
RBC: 5.19 10*6/uL (ref 4.20–5.80)
RDW: 12.8 % (ref 11.0–15.0)
Total Lymphocyte: 22.4 %
WBC: 6.9 10*3/uL (ref 3.8–10.8)

## 2022-02-21 LAB — PROTEIN ELECTROPHORESIS, SERUM, WITH REFLEX
Albumin ELP: 4 g/dL (ref 3.8–4.8)
Alpha 1: 0.3 g/dL (ref 0.2–0.3)
Alpha 2: 0.7 g/dL (ref 0.5–0.9)
Beta 2: 0.5 g/dL (ref 0.2–0.5)
Beta Globulin: 0.5 g/dL (ref 0.4–0.6)
Gamma Globulin: 1 g/dL (ref 0.8–1.7)
Total Protein: 7 g/dL (ref 6.1–8.1)

## 2022-02-21 LAB — CYCLIC CITRUL PEPTIDE ANTIBODY, IGG: Cyclic Citrullin Peptide Ab: <16 UNITS

## 2022-02-21 LAB — COMPLETE METABOLIC PANEL WITH GFR
AG Ratio: 1.4 (calc) (ref 1.0–2.5)
ALT: 21 U/L (ref 9–46)
AST: 17 U/L (ref 10–35)
Albumin: 4.1 g/dL (ref 3.6–5.1)
Alkaline phosphatase (APISO): 63 U/L (ref 35–144)
BUN: 20 mg/dL (ref 7–25)
CO2: 26 mmol/L (ref 20–32)
Calcium: 9.2 mg/dL (ref 8.6–10.3)
Chloride: 104 mmol/L (ref 98–110)
Creat: 1.26 mg/dL (ref 0.70–1.30)
Globulin: 3 g/dL (calc) (ref 1.9–3.7)
Glucose, Bld: 114 mg/dL — ABNORMAL HIGH (ref 65–99)
Potassium: 4.6 mmol/L (ref 3.5–5.3)
Sodium: 139 mmol/L (ref 135–146)
Total Bilirubin: 0.3 mg/dL (ref 0.2–1.2)
Total Protein: 7.1 g/dL (ref 6.1–8.1)
eGFR: 66 mL/min/{1.73_m2} (ref >=60)

## 2022-02-21 LAB — RHEUMATOID FACTOR: Rheumatoid fact SerPl-aCnc: <14 IU/mL (ref <14)

## 2022-02-21 LAB — HEPATITIS C ANTIBODY
Hepatitis C Ab: NONREACTIVE
SIGNAL TO CUT-OFF: 0.08 (ref <1.00)

## 2022-02-21 LAB — IGG, IGA, IGM
IgG (Immunoglobin G), Serum: 1121 mg/dL (ref 600–1640)
IgM, Serum: 45 mg/dL — ABNORMAL LOW (ref 50–300)
Immunoglobulin A: 319 mg/dL — ABNORMAL HIGH (ref 47–310)

## 2022-02-21 LAB — QUANTIFERON-TB GOLD PLUS
Mitogen-NIL: 9.03 IU/mL
NIL: 0.05 IU/mL
QuantiFERON-TB Gold Plus: NEGATIVE
TB1-NIL: 0 IU/mL
TB2-NIL: <0 IU/mL

## 2022-02-21 LAB — HLA-B27 ANTIGEN: HLA-B27 Antigen: NEGATIVE

## 2022-02-21 LAB — HEPATITIS B SURFACE ANTIGEN: Hepatitis B Surface Ag: NONREACTIVE

## 2022-02-21 LAB — HEPATITIS B CORE ANTIBODY, IGM: Hep B C IgM: NONREACTIVE

## 2022-02-21 LAB — CK: Total CK: 66 U/L (ref 44–196)

## 2022-02-21 LAB — ANA: Anti Nuclear Antibody (ANA): NEGATIVE

## 2022-02-21 LAB — SEDIMENTATION RATE: Sed Rate: 14 mm/h (ref 0–20)

## 2022-02-21 NOTE — Telephone Encounter (Addendum)
Submitted a Prior Authorization request to [facility] for HUMIRA via CoverMyMeds. Will update once we receive a response.  Key: [id number]  Patient may be denied for PsA diagnosis due to no previous trial of MTX. Patient has bamboo spine and axial involvement and would have minimum benefit with MTX. Requires escalation of therapy to biologic.  Once approved, patient is eligible for Humira copay card.  Patient states he completed consent form at [place] but not yet in media tab of chart  Knox Saliva, PharmD, MPH, BCPS, CPP Clinical Pharmacist (Rheumatology and Pulmonology)  ----- Message from Bo Merino, MD sent at 02/21/2022  8:42 AM EDT ----- All the labs are within normal limits.  He should be able to start Humira once approved.  I do not see a follow-up appointment scheduled.  Please schedule a follow-up appointment.

## 2022-02-21 NOTE — Progress Notes (Signed)
Spoke with patient. Humira BIV started. Patient will need appt on 03/07/22 r/s to be with Dr. Estanislado Pandy and in the morning. Will start patient on Humira at that visit if coverage is approved and in place.  Knox Saliva, PharmD, MPH, BCPS, CPP Clinical Pharmacist (Rheumatology and Pulmonology)

## 2022-02-21 NOTE — Progress Notes (Signed)
All the labs are within normal limits.  He should be able to start Humira once approved.  I do not see a follow-up appointment scheduled.  Please schedule a follow-up appointment.

## 2022-02-22 ENCOUNTER — Other Ambulatory Visit (HOSPITAL_COMMUNITY): Payer: Self-pay

## 2022-02-22 NOTE — Telephone Encounter (Signed)
Received notification from [facility] regarding a prior authorization for [place]. Authorization has been APPROVED from 02/21/22 to 02/22/23.   Unable to run test claim because patient must fill through [place]: [phone]   Authorization # [id number]  Phone # [phone]  Enrolled patient into Humira copay card using [employer] portal: ID: [id number] Rx GROUP: [id number] Rx BIN: [id number] Rx PCN: [id number]  Routing to Seth Bake H to have pt scheduled scheduled with Dr. Estanislado Pandy (will be starting Humira at the same visit)  Knox Saliva, PharmD, MPH, BCPS, CPP Clinical Pharmacist (Rheumatology and Pulmonology)

## 2022-02-22 NOTE — Telephone Encounter (Signed)
Spoke with patient and schedule appointment for 03/07/2022 at 11:20 am for new patient follow up and Humira new start.

## 2022-02-25 NOTE — Progress Notes (Signed)
Office Visit Note  Patient: Bryan Sanders             Date of Birth: 1961/07/12           MRN: [id number]             PCP: Gaynelle Arabian, MD Referring: Gaynelle Arabian, MD Visit Date: 03/07/2022 Occupation: @GUAROCC @  Subjective:  Medication management  History of Present Illness: BARRET ESQUIVEL is a 61 y.o. male with history of psoriasis and psoriatic arthritis.  He continues to have pain and stiffness in his cervical, thoracic and lumbar spine.  He also complains of discomfort in the SI joints.  He has psoriasis over the both palms.  He also had some psoriasis in the inguinal region which is improved per patient.  He continues to have pain and discomfort in his left knee joint due to severe osteoarthritis.  He has been followed by orthopedic surgeon.  Activities of Daily Living:  Patient reports morning stiffness for 0 minute.   Patient Reports nocturnal pain.  Difficulty dressing/grooming: Denies Difficulty climbing stairs: Reports Difficulty getting out of chair: Reports Difficulty using hands for taps, buttons, cutlery, and/or writing: Denies  Review of Systems  Constitutional:  Positive for fatigue.  HENT:  Negative for mouth sores and mouth dryness.   Eyes:  Positive for redness and dryness.  Respiratory:  Negative for cough.   Cardiovascular:  Negative for chest pain and palpitations.  Gastrointestinal:  Negative for abdominal pain, constipation, diarrhea, heartburn, nausea and vomiting.  Endocrine: Negative.   Genitourinary: Negative.   Musculoskeletal:  Positive for joint pain, gait problem and joint pain.  Skin:  Positive for rash. Negative for sensitivity to sunlight.       Psoriasis   Allergic/Immunologic:       Denies recent infections   Neurological:  Positive for dizziness. Negative for fainting and headaches.  Hematological: Negative.     PMFS History:  Patient Active Problem List   Diagnosis Date Noted   Esophageal dysphagia 03/07/2022   Erectile  dysfunction 03/07/2022   Essential hypertension 03/07/2022   Gout 03/07/2022   Hypertensive retinopathy 03/07/2022   Irritable bowel syndrome with diarrhea 03/07/2022   Osteoarthritis of knee 03/07/2022   Psoriasis 03/07/2022   Psoriatic arthritis ([place]) 03/07/2022   Pure hypercholesterolemia 03/07/2022   Obesity 07/21/2008   Obstructive sleep apnea 07/21/2008    Past Medical History:  Diagnosis Date   Ankylosis of lumbar spine    Hypertension    IBS (irritable bowel syndrome)    Pre-diabetes    Psoriasis    Sleep apnea    Spondylosis     Family History  Problem Relation Age of Onset   Diabetes Mother    Heart disease Mother    Diabetes Brother    Past Surgical History:  Procedure Laterality Date   APPENDECTOMY  09/09/1993   VASECTOMY     Social History   Social History Narrative   Not on file    There is no immunization history on file for this patient.   Objective: Vital Signs: BP 128/73   Pulse (!) 54   Ht 5\' 11"  (1.803 m)   Wt (!) 308 lb (139.7 kg)   BMI 42.96 kg/m    Physical Exam Vitals and nursing note reviewed.  Constitutional:      Appearance: He is well-developed.  HENT:     Head: Normocephalic and atraumatic.  Eyes:     Conjunctiva/sclera: Conjunctivae normal.     Pupils:  Pupils are equal, round, and reactive to light.  Cardiovascular:     Rate and Rhythm: Normal rate and regular rhythm.     Heart sounds: Normal heart sounds.  Pulmonary:     Effort: Pulmonary effort is normal.     Breath sounds: Normal breath sounds.  Abdominal:     General: Bowel sounds are normal.     Palpations: Abdomen is soft.  Musculoskeletal:     Cervical back: Normal range of motion and neck supple.  Skin:    General: Skin is warm and dry.     Capillary Refill: Capillary refill takes less than 2 seconds.     Comments: Bilateral palmar psoriasis was noted.  Neurological:     Mental Status: He is alert and oriented to person, place, and time.  Psychiatric:         Behavior: Behavior normal.      Musculoskeletal Exam: He had limited flexion extension and lateral rotation of the cervical spine.  He had limited range of motion of the thoracic and lumbar spine.  He had tenderness over SI joints.  Shoulder joints were in good range of motion.  He had contractures in bilateral elbows with no synovitis.  Wrist joints, MCPs and PIPs with good range of motion.  No DIP synovitis was noted.  He had limited range of motion of bilateral hip joints.  He had limited extension of his knee joints.  No warmth swelling or effusion was noted.  There was no tenderness over ankles or MTPs.  There was no evidence of Achilles tendinitis or planter fasciitis.  CDAI Exam: CDAI Score: -- Patient Global: --; Provider Global: -- Swollen: --; Tender: -- Joint Exam 03/07/2022   No joint exam has been documented for this visit   There is currently no information documented on the homunculus. Go to the Rheumatology activity and complete the homunculus joint exam.  Investigation: No additional findings.  Imaging: XR Thoracic Spine 2 View  Result Date: 02/15/2022 Thoracic kyphosis was noted.  Multilevel spondylosis with disc space narrowing was noted.  Syndesmophytes were noted in the thoracic and lumbar region with appearance of a bamboo spine. These findings are consistent with multilevel spondylosis and spondyloarthropathy.  XR Cervical Spine 2 or 3 views  Result Date: 02/15/2022 C4-C5 narrowing was noted.  Anterior osteophytes and syndesmophytes noted. Facet joint arthropathy was noted. Impression: These findings are consistent with degenerative changes, facet joint arthropathy and spondyloarthropathy.  XR Pelvis 1-2 Views  Result Date: 02/15/2022 Bilateral SI joint sclerosis was noted, more prominent on the right side.  No significant SI joint narrowing was noted.  Possible erosive changes were noted in the right SI joint. Impression: These findings are consistent with SI  joint sclerosis with erosive changes.   Recent Labs: Lab Results  Component Value Date   WBC 6.9 02/15/2022   HGB 16.2 02/15/2022   PLT 176 02/15/2022   NA 139 02/15/2022   K 4.6 02/15/2022   CL 104 02/15/2022   CO2 26 02/15/2022   GLUCOSE 114 (H) 02/15/2022   BUN 20 02/15/2022   CREATININE 1.26 02/15/2022   BILITOT 0.3 02/15/2022   AST 17 02/15/2022   ALT 21 02/15/2022   PROT 7.1 02/15/2022   PROT 7.0 02/15/2022   CALCIUM 9.2 02/15/2022   QFTBGOLDPLUS NEGATIVE 02/15/2022   February 15, 2022 SPEP normal, immunoglobulins IgA mildly elevated, IgM low at 45, TB Gold negative, hepatitis B-, hepatitis C negative, CK 66, ESR 14, RF negative, anti-CCP negative, ANA  negative, HLA-B27 negative  Speciality Comments: No specialty comments available.  Procedures:  No procedures performed Allergies: Patient has no known allergies.   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  SI joint or sclerosis was noted.  History of thoracic kyphosis for many years.  He continues to have pain and discomfort in his entire spine.  We discussed use of Humira at the last visit.  Patient wanted to proceed with Humira.  Consent was obtained at the last visit.  We applied for Humira which has been approved.  He wants to start on Humira today.  Indications and side effects were again reviewed with patient and his wife who was on the phone.  Patient was given his first Humira injection in the office and he was observed for 30 minutes.  He tolerated the injection well.  He will start taking Humira 40 mg subcu every other week.  Psoriasis - History of psoriasis for last 10 years diagnosed by a dermatologist per patient.  Palmar psoriasis was noted on the examination.  He states the psoriasis in the inguinal region has improved with the use of topical agents.  High risk medication use - A handout on Humira was given at the  last visit.  He was advised to get labs and a month and then every 3 months to monitor for drug toxicity.  He will need annual TB Gold.  Information regarding immunization was again placed in the AVS.  He was advised to stop Humira if he develops an infection and resume after the infection resolves.  Annual skin examination to screen for skin cancer was advised while he was on room air.  Primary osteoarthritis of left knee - History of chronic knee joint pain.  X-rays showed severe end-stage osteoarthritis.  He had cortisone injections in the past.  He is followed by orthopedic surgeon and will require total knee replacement in the future.  Pain in neck - He had limited lateral rotation and extension.  X-ray showed degenerative changes and syndesmophytes.  Chronic pain  Pain in thoracic spine - Bamboo spine was noted..  Chronic pain  Chronic midline low back pain with bilateral sciatica - Lumbar spine was noted.  He had limited mobility and discomfort with mobility.  Bilateral sacroiliitis (HCC) - History of chronic gluteal pain.  X-rays showed bilateral sacroiliitis.  We will see response to the treatment.  Essential hypertension-blood pressure was normal today.  Other medical problems listed as follows:  History of diabetes mellitus  History of gastroesophageal reflux (GERD)  History of IBS  Myalgia  Obstructive sleep apnea  Family history of psoriasis- daughter  Orders: Orders Placed This Encounter  Procedures   CBC with Differential/Platelet   COMPLETE METABOLIC PANEL WITH GFR   Meds ordered this encounter  Medications   Adalimumab (HUMIRA PEN) 40 MG/0.4ML PNKT    Sig: Inject 40 mg into the skin every 14 (fourteen) days. 1 kit - 2 pens    Dispense:  6 each    Refill:  0    Copay card: ID: [id number]; Rx GROUP: [id number]; Rx BIN: [id number]; Rx PCN: OHCP    Order Specific Question:   Supervising Provider    Answer:   Bo Merino [[id number]]     Follow-Up Instructions:  Return in about 6 weeks (around 04/18/2022) for Psoriatic arthritis.   Bo Merino, MD  Note - This record has been created using Dragon  software.  Chart creation errors have been sought, but may not always  have been located. Such creation errors do not reflect on  the standard of medical care.

## 2022-03-07 ENCOUNTER — Ambulatory Visit: Payer: 59 | Admitting: Rheumatology

## 2022-03-07 ENCOUNTER — Ambulatory Visit: Payer: 59 | Admitting: Pharmacist

## 2022-03-07 ENCOUNTER — Encounter: Payer: Self-pay | Admitting: Rheumatology

## 2022-03-07 VITALS — BP 128/73 | HR 54 | Ht 71.0 in | Wt 308.0 lb

## 2022-03-07 DIAGNOSIS — N529 Male erectile dysfunction, unspecified: Secondary | ICD-10-CM | POA: Insufficient documentation

## 2022-03-07 DIAGNOSIS — Z8719 Personal history of other diseases of the digestive system: Secondary | ICD-10-CM

## 2022-03-07 DIAGNOSIS — K580 Irritable bowel syndrome with diarrhea: Secondary | ICD-10-CM | POA: Insufficient documentation

## 2022-03-07 DIAGNOSIS — I10 Essential (primary) hypertension: Secondary | ICD-10-CM

## 2022-03-07 DIAGNOSIS — R1319 Other dysphagia: Secondary | ICD-10-CM | POA: Insufficient documentation

## 2022-03-07 DIAGNOSIS — M5441 Lumbago with sciatica, right side: Secondary | ICD-10-CM

## 2022-03-07 DIAGNOSIS — M1712 Unilateral primary osteoarthritis, left knee: Secondary | ICD-10-CM | POA: Diagnosis not present

## 2022-03-07 DIAGNOSIS — Z79899 Other long term (current) drug therapy: Secondary | ICD-10-CM | POA: Diagnosis not present

## 2022-03-07 DIAGNOSIS — M5442 Lumbago with sciatica, left side: Secondary | ICD-10-CM

## 2022-03-07 DIAGNOSIS — L4050 Arthropathic psoriasis, unspecified: Secondary | ICD-10-CM | POA: Diagnosis not present

## 2022-03-07 DIAGNOSIS — H35039 Hypertensive retinopathy, unspecified eye: Secondary | ICD-10-CM | POA: Insufficient documentation

## 2022-03-07 DIAGNOSIS — L409 Psoriasis, unspecified: Secondary | ICD-10-CM | POA: Diagnosis not present

## 2022-03-07 DIAGNOSIS — E7800 Pure hypercholesterolemia, unspecified: Secondary | ICD-10-CM | POA: Insufficient documentation

## 2022-03-07 DIAGNOSIS — Z8639 Personal history of other endocrine, nutritional and metabolic disease: Secondary | ICD-10-CM

## 2022-03-07 DIAGNOSIS — M461 Sacroiliitis, not elsewhere classified: Secondary | ICD-10-CM

## 2022-03-07 DIAGNOSIS — M546 Pain in thoracic spine: Secondary | ICD-10-CM

## 2022-03-07 DIAGNOSIS — M791 Myalgia, unspecified site: Secondary | ICD-10-CM

## 2022-03-07 DIAGNOSIS — Z840 Family history of diseases of the skin and subcutaneous tissue: Secondary | ICD-10-CM

## 2022-03-07 DIAGNOSIS — M542 Cervicalgia: Secondary | ICD-10-CM

## 2022-03-07 DIAGNOSIS — G8929 Other chronic pain: Secondary | ICD-10-CM

## 2022-03-07 DIAGNOSIS — G4733 Obstructive sleep apnea (adult) (pediatric): Secondary | ICD-10-CM

## 2022-03-07 DIAGNOSIS — M179 Osteoarthritis of knee, unspecified: Secondary | ICD-10-CM | POA: Insufficient documentation

## 2022-03-07 DIAGNOSIS — M109 Gout, unspecified: Secondary | ICD-10-CM | POA: Insufficient documentation

## 2022-03-07 MED ORDER — HUMIRA (2 PEN) 40 MG/0.4ML ~~LOC~~ AJKT: 40.0000 mg | AUTO-INJECTOR | SUBCUTANEOUS | 0 refills | Status: DC

## 2022-03-07 NOTE — Progress Notes (Signed)
Pharmacy Note  Subjective:   Patient presents to clinic today to receive first dose of Humira for PsA and psoriasis. Naive to DMARDs and biologics.   Patient running a fever or have signs/symptoms of infection? No  Patient currently on antibiotics for the treatment of infection? No  Patient have any upcoming invasive procedures/surgeries? No  Objective: CMP     Component Value Date/Time   NA 139 02/15/2022 0928   K 4.6 02/15/2022 0928   CL 104 02/15/2022 0928   CO2 26 02/15/2022 0928   GLUCOSE 114 (H) 02/15/2022 0928   BUN 20 02/15/2022 0928   CREATININE 1.26 02/15/2022 0928   CALCIUM 9.2 02/15/2022 0928   PROT 7.1 02/15/2022 0928   PROT 7.0 02/15/2022 0928   AST 17 02/15/2022 0928   ALT 21 02/15/2022 0928   BILITOT 0.3 02/15/2022 0928    CBC    Component Value Date/Time   WBC 6.9 02/15/2022 0928   RBC 5.19 02/15/2022 0928   HGB 16.2 02/15/2022 0928   HCT 46.8 02/15/2022 0928   PLT 176 02/15/2022 0928   MCV 90.2 02/15/2022 0928   MCH 31.2 02/15/2022 0928   MCHC 34.6 02/15/2022 0928   RDW 12.8 02/15/2022 0928   LYMPHSABS 1,546 02/15/2022 0928   EOSABS 269 02/15/2022 0928   BASOSABS 48 02/15/2022 0928    Baseline Immunosuppressant Therapy Labs TB GOLD    Latest Ref Rng & Units 02/15/2022    9:28 AM  Quantiferon TB Gold  Quantiferon TB Gold Plus NEGATIVE NEGATIVE    Hepatitis Panel    Latest Ref Rng & Units 02/15/2022    9:28 AM  Hepatitis  Hep B Surface Ag NON-REACTIVE NON-REACTIVE   Hep B IgM NON-REACTIVE NON-REACTIVE   Hep C Ab NON-REACTIVE NON-REACTIVE    HIV No results found for: "HIV" Immunoglobulins    Latest Ref Rng & Units 02/15/2022    9:28 AM  Immunoglobulin Electrophoresis  IgA  47 - 310 mg/dL 319   IgG 600 - 1,640 mg/dL 1,121   IgM 50 - 300 mg/dL 45    SPEP    Latest Ref Rng & Units 02/15/2022    9:28 AM  Serum Protein Electrophoresis  Total Protein 6.1 - 8.1 g/dL 6.1 - 8.1 g/dL 7.1    7.0   Albumin 3.8 - 4.8 g/dL 4.0   Alpha-1 0.2  - 0.3 g/dL 0.3   Alpha-2 0.5 - 0.9 g/dL 0.7   Beta Globulin 0.4 - 0.6 g/dL 0.5   Beta 2 0.2 - 0.5 g/dL 0.5   Gamma Globulin 0.8 - 1.7 g/dL 1.0    G6PD No results found for: "G6PDH" TPMT No results found for: "TPMT"   Assessment/Plan:  Counseled patient that Humira is a TNF blocking agent.  Counseled patient on purpose, proper use, and adverse effects of Humira.  Reviewed the most common adverse effects including infections, headache, and injection site reactions. Discussed that there is the possibility of an increased risk of malignancy including non-melanoma skin cancer but it is not well understood if this increased risk is due to the medication or the disease state.  Advised patient to get yearly dermatology exams due to risk of skin cancer. Counseled patient that Humira should be held prior to scheduled surgery.  Counseled patient to avoid live vaccines while on Humira.  Recommend annual influenza, PCV 15 or PCV20 or Pneumovax 23, and Shingrix as indicated. He is planning to get Hepatitis vaccine and second Shingrix vaccine. Reviewed that he can hold  Humira for a few days (but not required per guidelines) due to possibility for flu-like symptoms after vaccines.  Reviewed the importance of regular labs while on Humira therapy.  Will monitor CBC and CMP 1 month after starting and then every 3 months routinely thereafter. Will monitor TB gold annually. Standing orders placed.    Provided patient with medication education material and answered all questions.  Patient consented to Humira.  Will upload consent into the media tab.  Reviewed storage instructions of Humira.    Patient verbalized understanding.   Demonstrated proper injection technique with Humira demo device  Patient able to demonstrate proper injection technique using the teach back method.  Patient self injected in the right thigh with:  Sample Medication: Humira '40mg'$ /ml autoinjector pen NDC: [address]-83 Lot: [id number] Expiration:  03/2023  Patient tolerated well.  Observed for 30 mins in office for adverse reaction and none noted.   Patient is to return in 1 month for labs and 6-8 weeks for follow-up appointment.  Standing orders placed.   Humira approved through insurance .   Rx sent to: [facility]: [phone] .  Patient provided with pharmacy phone number and advised to call later this week to schedule shipment to home.  Patient will continue Humira 40 mg SQ every 14 days as monotherapy.  Patient previously seeing [facility]. Advised to re-establish care and get yearly skin checks while on Humira due to risk fro non-melanoma skin cancer while on TNF inhibitors.  All questions encouraged and answered.  Instructed patient to call with any further questions or concerns.  Knox Saliva, PharmD, MPH, BCPS, CPP Clinical Pharmacist (Rheumatology and Pulmonology)  03/07/2022 11:45 AM

## 2022-03-07 NOTE — Patient Instructions (Addendum)
Please re-establish care with Dr. Allyson Sabal with [facility] for yearly skin checks while on Humira. If any issues, let us know and we will place referral elsewhere  Your next HUMIRA dose is due on 03/21/22, 04/04/22, and every 14 days thereafter  [place] if you have signs or symptoms of an infection. You can resume once you feel better or back to your baseline. HOLD HUMIRA if you start antibiotics to treat an infection. HOLD HUMIRA around the time of surgery/procedures. Your surgeon will be able to provide recommendations on when to hold BEFORE and when you are cleared to [place].  Pharmacy information: Your prescription will be shipped from [facility]. Their phone number is [phone] Please call to schedule shipment and confirm address. They will mail your medication to your home.  Cost information: Your copay should be affordable. If you call the pharmacy and it is not affordable, please double-check that they are billing through your copay card as secondary coverage. That copay card information is: ID: [id number] Rx GROUP: [id number] Rx BIN: [id number] Rx PCN: [id number]  Labs are due in 1 month then every 3 months. Lab hours are from Monday to Thursday 1:30-4:30pm and Friday 1:30-4pm. You do not need an appointment if you come for labs during these times.  How to manage an injection site reaction: Remember the 5 C's: COUNTER - leave on the counter at least 30 minutes but up to overnight to bring medication to room temperature. This may help prevent stinging COLD - place something cold (like an ice gel pack or cold water bottle) on the injection site just before cleansing with alcohol. This may help reduce pain CLARITIN - use Claritin (generic name is loratadine) for the first two weeks of treatment or the day of, the day before, and the day after injecting. This will help to minimize injection site reactions CORTISONE CREAM - apply if injection site is irritated and  itching CALL ME - if injection site reaction is bigger than the size of your fist, looks infected, blisters, or if you develop hives Standing Labs We placed an order today for your standing lab work.   Please have your standing labs drawn in August and every 3 months  If possible, please have your labs drawn 2 weeks prior to your appointment so that the provider can discuss your results at your appointment.  Please note that you may see your imaging and lab results in [place] before we have reviewed them. We may be awaiting multiple results to interpret others before contacting you. Please allow our office up to 72 hours to thoroughly review all of the results before contacting the office for clarification of your results.  We have open lab daily: Monday through Thursday from 1:30-4:30 PM and Friday from 1:30-4:00 PM at the office of Dr. Bo Merino, [facility].   Please be advised, all patients with office appointments requiring lab work will take precedent over walk-in lab work.  If possible, please come for your lab work on Monday and Friday afternoons, as you may experience shorter wait times. The office is located at [address] [place], [place], [place] [address] No appointment is necessary.   Labs are drawn by [facility]. Please bring your co-pay at the time of your lab draw.  You may receive a bill from [facility] [place] for your lab work.  Please note if you are on Hydroxychloroquine and and an order has been placed for a Hydroxychloroquine level, you will need to have  it drawn 4 hours or more after your last dose.  If you wish to have your labs drawn at another location, please call the office 24 hours in advance to send orders.  If you have any questions regarding directions or hours of operation,  please call [phone].   As a reminder, please drink plenty of water prior to coming for your lab work. Thanks!   Vaccines You are taking a medication(s) that can  suppress your immune system.  The following immunizations are recommended: Flu annually Covid-19  Td/Tdap (tetanus, diphtheria, pertussis) every 10 years Pneumonia (Prevnar 15 then Pneumovax 23 at least 1 year apart.  Alternatively, can take Prevnar 20 without needing additional dose) Shingrix: 2 doses from 4 weeks to 6 months apart  Please check with your PCP to make sure you are up to date.

## 2022-04-04 NOTE — Progress Notes (Unsigned)
Office Visit Note  Patient: Bryan Sanders             Date of Birth: 1961/05/25           MRN: [id number]             PCP: Gaynelle Arabian, MD Referring: Gaynelle Arabian, MD Visit Date: 04/18/2022 Occupation: _0 @  Subjective:  Medication monitoring   History of Present Illness: Bryan Sanders is a 61 y.o. male with history of psoriatic arthritis.  He is prescribed Humira 40 mg subcutaneous injections every 14 days.  He was started on humira on 03/07/22. He denies any side effects or injection site reactions. He has noticed about a 50% improvement in his joint pain and stiffness since initiating Humira.  He states that the day prior to his Humira injection he notices increased pain and stiffness in his hands.  He is due for his Humira dose today and is having some pain in both CMC joints and stiffness in both hands.  He denies any joint swelling.  His SI joint and lower back discomfort have improved.  He denies any achilles tendonitis or plantar fasciitis.   He had a recent left knee joint cortisone injection performed by Dr. Ronnie Derby, which has alleviated his discomfort.    Activities of Daily Living:  Patient reports morning stiffness for 0 minutes.   Patient Denies nocturnal pain.  Difficulty dressing/grooming: Denies Difficulty climbing stairs: Denies Difficulty getting out of chair: Denies Difficulty using hands for taps, buttons, cutlery, and/or writing: Reports  Review of Systems  Constitutional:  Positive for fatigue.  HENT:  Positive for mouth dryness. Negative for mouth sores.   Eyes:  Negative for dryness.  Respiratory:  Negative for shortness of breath.   Cardiovascular:  Negative for chest pain and palpitations.  Gastrointestinal:  Negative for blood in stool, constipation and diarrhea.  Endocrine: Negative for increased urination.  Genitourinary:  Negative for involuntary urination.  Musculoskeletal:  Positive for joint pain and joint pain. Negative for gait problem,  joint swelling, myalgias, muscle weakness, morning stiffness, muscle tenderness and myalgias.  Skin:  Negative for color change, rash and sensitivity to sunlight.  Allergic/Immunologic: Negative for susceptible to infections.  Neurological:  Negative for dizziness and headaches.  Hematological:  Negative for swollen glands.  Psychiatric/Behavioral:  Negative for depressed mood and sleep disturbance. The patient is not nervous/anxious.     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   Osteoarthritis of knee 03/07/2022   Psoriasis 03/07/2022   Psoriatic arthritis ([place]) 03/07/2022   Pure hypercholesterolemia 03/07/2022   Obesity 07/21/2008   Obstructive sleep apnea 07/21/2008    Past Medical History:  Diagnosis Date   Ankylosis of lumbar spine    Hypertension    IBS (irritable bowel syndrome)    Pre-diabetes    Psoriasis    Sleep apnea    Spondylosis     Family History  Problem Relation Age of Onset   Diabetes Mother    Heart disease Mother    Diabetes Brother    Past Surgical History:  Procedure Laterality Date   APPENDECTOMY  09/09/1993   VASECTOMY     Social History   Social History Narrative   Not on file    There is no immunization history  on file for this patient.   Objective: Vital Signs: BP 127/78 (BP Location: Left Arm, Patient Position: Sitting, Cuff Size: Normal)   Pulse (!) 51   Resp 17   Ht _0  (1.778 m)   Wt (!) 315 lb 3.2 oz (143 kg)   BMI 45.23 kg/m    Physical Exam Vitals and nursing note reviewed.  Constitutional:      Appearance: He is well-developed.  HENT:     Head: Normocephalic and atraumatic.  Eyes:     Conjunctiva/sclera: Conjunctivae normal.     Pupils: Pupils are equal, round, and reactive  to light.  Cardiovascular:     Rate and Rhythm: Normal rate and regular rhythm.     Heart sounds: Normal heart sounds.  Pulmonary:     Effort: Pulmonary effort is normal.     Breath sounds: Normal breath sounds.  Abdominal:     General: Bowel sounds are normal.     Palpations: Abdomen is soft.  Musculoskeletal:     Cervical back: Normal range of motion and neck supple.  Skin:    General: Skin is warm and dry.     Capillary Refill: Capillary refill takes less than 2 seconds.     Comments:  Bilateral palmar psoriasis   Neurological:     Mental Status: He is alert and oriented to person, place, and time.  Psychiatric:        Behavior: Behavior normal.     Bilateral palmar psoriasis   Musculoskeletal Exam: C-spine has limited flexion and extension.  Thoracic kyphosis. No midline spinal tenderness.  Some tenderness over SI joint.  Shoulder joints, elbow joints, wrist joints, MCPs, PIPs, and DIPs good ROM with no synovitis.  PIP and DIP thickening consistent with OA.  CMC joint tenderness bilaterally. Complete fist formation bilaterally.  Hip joints have limited ROM.  Limited extension of left knee. Ankle joints have good ROM.   CDAI Exam: CDAI Score: -- Patient Global: --; Provider Global: -- Swollen: --; Tender: -- Joint Exam 04/18/2022   No joint exam has been documented for this visit   There is currently no information documented on the homunculus. Go to the Rheumatology activity and complete the homunculus joint exam.  Investigation: No additional findings.  Imaging: No results found.  Recent Labs: Lab Results  Component Value Date   WBC 6.9 04/12/2022   HGB 15.1 04/12/2022   PLT 166 04/12/2022   NA 140 04/12/2022   K 4.2 04/12/2022   CL 107 04/12/2022   CO2 25 04/12/2022   GLUCOSE 110 04/12/2022   BUN 16 04/12/2022   CREATININE 1.13 04/12/2022   BILITOT 0.4 04/12/2022   AST 19 04/12/2022   ALT 25 04/12/2022   PROT 6.7 04/12/2022   CALCIUM 9.1 04/12/2022    QFTBGOLDPLUS NEGATIVE 02/15/2022    Speciality Comments: No specialty comments available.  Procedures:  No procedures performed Allergies: Patient has no known allergies.   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  SI joint sclerosis. History of thoracic kyphosis for many years: He has no synovitis or dactylitis on examination today.  No evidence of Achilles tendinitis or plantar fasciitis.  He continues to experience pain and stiffness in his lower back and has mild tenderness over both SI joints on examination today.  Both hip joints have slightly limited range of motion but no groin pain currently.  He is currently on Humira  40 mg subcu as injections every 14 days.  He initiated Humira on 03/07/2022.  He has been tolerating Humira without any side effects or injection site reactions.  He has not missed any doses recently.  He has not had any recent or recurrent infections since initiating Humira.  He has noticed about a 50% improvement in his joint pain and stiffness since initiating therapy.  He has had increased pain and stiffness in his hands the day before he is due for his Humira injections.  He was advised to notify us if he starts to have increased breakthrough symptoms.  He is willing to give Humira more time and for [place] to reassess for the full efficacy in 2 to 3 months.  Psoriasis - Dx by dermatology-10 years ago.  Palmar pustular psoriasis noted.  Improving.   High risk medication use - Humira 40 mg sq injections every 14 days- initiated on 03/07/22 TB gold negative on 02/15/22.  CBC and CMP WNL on 04/12/22.  His next lab work will be due in November and every 3 months.  Standing orders for CBC and CMP remain in place.  Discussed the importance of holding humira if he develops signs or symptoms of an infection and to resume once the infection has completely cleared. Discussed the importance  of yearly skin exams while on humira due to the increased risk for skin cancer. He has received 1 Shingrix vaccine so far.  He plans on receiving the influenza vaccine once available.  Primary osteoarthritis of left knee: Chronic pain.  He had a recent left knee cortisone injection performed by Dr. Ronnie Derby.   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 Chronic pain  Bilateral sacroiliitis (HCC) - History of chronic gluteal pain.  X-rays showed bilateral sacroiliitis. Improving.  Mild tenderness over both SI joint.   Other medical conditions are listed as follows:   Essential hypertension: BP was 127/78 today in the office.   History of diabetes mellitus  History of gastroesophageal reflux (GERD)  History of IBS  Obstructive sleep apnea  Family history of psoriasis- daughter  Orders: No orders of the defined types were placed in this encounter.  No orders of the defined types were placed in this encounter.    Follow-Up Instructions: Return in about 3 months (around 07/19/2022) for Psoriatic arthritis.   Ofilia Neas, PA-C  Note - This record has been created using Dragon software.  Chart creation errors have been sought, but may not always  have been located. Such creation errors do not reflect on  the standard of medical care.

## 2022-04-12 ENCOUNTER — Other Ambulatory Visit: Payer: Self-pay | Admitting: *Deleted

## 2022-04-12 DIAGNOSIS — L4050 Arthropathic psoriasis, unspecified: Secondary | ICD-10-CM

## 2022-04-12 DIAGNOSIS — L409 Psoriasis, unspecified: Secondary | ICD-10-CM

## 2022-04-12 DIAGNOSIS — Z79899 Other long term (current) drug therapy: Secondary | ICD-10-CM

## 2022-04-13 LAB — COMPLETE METABOLIC PANEL WITH GFR
AG Ratio: 1.6 (calc) (ref 1.0–2.5)
ALT: 25 U/L (ref 9–46)
AST: 19 U/L (ref 10–35)
Albumin: 4.1 g/dL (ref 3.6–5.1)
Alkaline phosphatase (APISO): 59 U/L (ref 35–144)
BUN: 16 mg/dL (ref 7–25)
CO2: 25 mmol/L (ref 20–32)
Calcium: 9.1 mg/dL (ref 8.6–10.3)
Chloride: 107 mmol/L (ref 98–110)
Creat: 1.13 mg/dL (ref 0.70–1.35)
Globulin: 2.6 g/dL (calc) (ref 1.9–3.7)
Glucose, Bld: 110 mg/dL (ref 65–139)
Potassium: 4.2 mmol/L (ref 3.5–5.3)
Sodium: 140 mmol/L (ref 135–146)
Total Bilirubin: 0.4 mg/dL (ref 0.2–1.2)
Total Protein: 6.7 g/dL (ref 6.1–8.1)
eGFR: 74 mL/min/{1.73_m2} (ref >=60)

## 2022-04-13 LAB — CBC WITH DIFFERENTIAL/PLATELET
Absolute Monocytes: 490 cells/uL (ref 200–950)
Basophils Absolute: 41 cells/uL (ref 0–200)
Basophils Relative: 0.6 %
Eosinophils Absolute: 228 cells/uL (ref 15–500)
Eosinophils Relative: 3.3 %
HCT: 43.1 % (ref 38.5–50.0)
Hemoglobin: 15.1 g/dL (ref 13.2–17.1)
Lymphs Abs: 1780 cells/uL (ref 850–3900)
MCH: 32.3 pg (ref 27.0–33.0)
MCHC: 35 g/dL (ref 32.0–36.0)
MCV: 92.3 fL (ref 80.0–100.0)
MPV: 9 fL (ref 7.5–12.5)
Monocytes Relative: 7.1 %
Neutro Abs: 4361 cells/uL (ref 1500–7800)
Neutrophils Relative %: 63.2 %
Platelets: 166 10*3/uL (ref 140–400)
RBC: 4.67 10*6/uL (ref 4.20–5.80)
RDW: 13.2 % (ref 11.0–15.0)
Total Lymphocyte: 25.8 %
WBC: 6.9 10*3/uL (ref 3.8–10.8)

## 2022-04-13 NOTE — Progress Notes (Signed)
CBC and CMP are normal.

## 2022-04-18 ENCOUNTER — Ambulatory Visit: Payer: 59 | Attending: Physician Assistant | Admitting: Physician Assistant

## 2022-04-18 ENCOUNTER — Encounter: Payer: Self-pay | Admitting: Physician Assistant

## 2022-04-18 VITALS — BP 127/78 | HR 51 | Resp 17 | Ht 70.0 in | Wt 315.2 lb

## 2022-04-18 DIAGNOSIS — M546 Pain in thoracic spine: Secondary | ICD-10-CM

## 2022-04-18 DIAGNOSIS — Z79899 Other long term (current) drug therapy: Secondary | ICD-10-CM | POA: Diagnosis not present

## 2022-04-18 DIAGNOSIS — G4733 Obstructive sleep apnea (adult) (pediatric): Secondary | ICD-10-CM

## 2022-04-18 DIAGNOSIS — Z8639 Personal history of other endocrine, nutritional and metabolic disease: Secondary | ICD-10-CM

## 2022-04-18 DIAGNOSIS — M1712 Unilateral primary osteoarthritis, left knee: Secondary | ICD-10-CM

## 2022-04-18 DIAGNOSIS — Z8719 Personal history of other diseases of the digestive system: Secondary | ICD-10-CM

## 2022-04-18 DIAGNOSIS — L4050 Arthropathic psoriasis, unspecified: Secondary | ICD-10-CM | POA: Diagnosis not present

## 2022-04-18 DIAGNOSIS — G8929 Other chronic pain: Secondary | ICD-10-CM

## 2022-04-18 DIAGNOSIS — L409 Psoriasis, unspecified: Secondary | ICD-10-CM

## 2022-04-18 DIAGNOSIS — M461 Sacroiliitis, not elsewhere classified: Secondary | ICD-10-CM

## 2022-04-18 DIAGNOSIS — Z840 Family history of diseases of the skin and subcutaneous tissue: Secondary | ICD-10-CM

## 2022-04-18 DIAGNOSIS — I10 Essential (primary) hypertension: Secondary | ICD-10-CM

## 2022-04-18 NOTE — Patient Instructions (Addendum)
Standing Labs We placed an order today for your standing lab work.   Please have your standing labs drawn in November and every 3 months   If possible, please have your labs drawn 2 weeks prior to your appointment so that the provider can discuss your results at your appointment.  Please note that you may see your imaging and lab results in [place] before we have reviewed them. We may be awaiting multiple results to interpret others before contacting you. Please allow our office up to 72 hours to thoroughly review all of the results before contacting the office for clarification of your results.  We have open lab daily: Monday through Thursday from 1:30-4:30 PM and Friday from 1:30-4:00 PM at the office of Dr. Bo Merino, [facility].   Please be advised, all patients with office appointments requiring lab work will take precedent over walk-in lab work.  If possible, please come for your lab work on Monday and Friday afternoons, as you may experience shorter wait times. The office is located at [address], [place], [place], [place] [address] No appointment is necessary.   Labs are drawn by [facility]. Please bring your co-pay at the time of your lab draw.  You may receive a bill from [facility] for your lab work.  Please note if you are on Hydroxychloroquine and and an order has been placed for a Hydroxychloroquine level, you will need to have it drawn 4 hours or more after your last dose.  If you wish to have your labs drawn at another location, please call the office 24 hours in advance to send orders.  If you have any questions regarding directions or hours of operation,  please call [phone].   As a reminder, please drink plenty of water prior to coming for your lab work. Thanks!   If you have signs or symptoms of an infection or start antibiotics: First, call your PCP for workup of your infection. Hold your medication through the infection, until you complete  your antibiotics, and until symptoms resolve if you take the following: Injectable medication (Actemra, Benlysta, Cimzia, Cosentyx, Enbrel, Humira, Kevzara, Orencia, Remicade, Simponi, Stelara, Taltz, Tremfya) Methotrexate Leflunomide (Arava) Mycophenolate (Cellcept) Morrie Sheldon, Olumiant, or Rinvoq  Vaccines You are taking a medication(s) that can suppress your immune system.  The following immunizations are recommended: Flu annually Covid-19  Td/Tdap (tetanus, diphtheria, pertussis) every 10 years Pneumonia (Prevnar 15 then Pneumovax 23 at least 1 year apart.  Alternatively, can take Prevnar 20 without needing additional dose) Shingrix: 2 doses from 4 weeks to 6 months apart  Please check with your PCP to make sure you are up to date.

## 2022-05-02 ENCOUNTER — Ambulatory Visit: Payer: 59 | Admitting: Rheumatology

## 2022-05-10 ENCOUNTER — Other Ambulatory Visit (HOSPITAL_COMMUNITY): Payer: Self-pay

## 2022-05-10 MED ORDER — METOPROLOL SUCCINATE ER 50 MG PO TB24
25.0000 mg | ORAL_TABLET | Freq: Every day | ORAL | 3 refills | Status: DC
  Filled 2022-05-10: qty 15, 30d supply, fill #0

## 2022-05-10 MED ORDER — AMLODIPINE BESYLATE 5 MG PO TABS
5.0000 mg | ORAL_TABLET | Freq: Every day | ORAL | 3 refills | Status: DC
Start: 2022-05-10 — End: 2022-05-10
  Filled 2022-05-10: qty 30, 30d supply, fill #0

## 2022-05-10 MED ORDER — SPIRONOLACTONE 25 MG PO TABS
25.0000 mg | ORAL_TABLET | Freq: Every day | ORAL | 3 refills | Status: DC
Start: 2022-05-10 — End: 2022-05-10
  Filled 2022-05-10: qty 30, 30d supply, fill #0

## 2022-05-10 MED ORDER — LISINOPRIL 40 MG PO TABS
40.0000 mg | ORAL_TABLET | Freq: Every day | ORAL | 3 refills | Status: AC
Start: 2022-05-10 — End: ?
  Filled 2022-05-10: qty 30, 30d supply, fill #0

## 2022-05-10 MED ORDER — GLIMEPIRIDE 2 MG PO TABS
2.0000 mg | ORAL_TABLET | Freq: Every day | ORAL | 3 refills | Status: AC
  Filled 2022-05-10: qty 30, 30d supply, fill #0

## 2022-05-10 MED ORDER — OMEPRAZOLE 20 MG PO CPDR
20.0000 mg | DELAYED_RELEASE_CAPSULE | Freq: Every day | ORAL | 3 refills | Status: DC
  Filled 2022-05-10: qty 30, 30d supply, fill #0

## 2022-05-10 MED ORDER — JARDIANCE 25 MG PO TABS
25.0000 mg | ORAL_TABLET | Freq: Every day | ORAL | 3 refills | Status: DC
  Filled 2022-05-10: qty 30, 30d supply, fill #0

## 2022-05-15 ENCOUNTER — Telehealth: Payer: Self-pay | Admitting: *Deleted

## 2022-05-15 NOTE — Telephone Encounter (Signed)
Labs received from: [facility] on: 05/10/2022  Reviewed by:Hazel Sams, PA-C  Labs drawn:CBC/CMP  Results:Glucose 126   MPV 7.1  Patient is on Humira 40 mg SQ every 14 days.

## 2022-05-25 ENCOUNTER — Other Ambulatory Visit: Payer: Self-pay | Admitting: Rheumatology

## 2022-05-25 DIAGNOSIS — L409 Psoriasis, unspecified: Secondary | ICD-10-CM

## 2022-05-25 DIAGNOSIS — L4050 Arthropathic psoriasis, unspecified: Secondary | ICD-10-CM

## 2022-05-25 DIAGNOSIS — Z79899 Other long term (current) drug therapy: Secondary | ICD-10-CM

## 2022-05-27 NOTE — Telephone Encounter (Signed)
Next Visit: 07/12/2022  Last Visit: 04/18/2022  Last Fill: 03/07/2022  UJ:[id number] arthritis (HCC)-HLA-B27 negative  Current Dose per office note 04/18/2022: Humira 40 mg sq injections every 14 days  Labs: 04/12/2022 CBC and CMP are normal.  TB Gold: 02/15/2022 Neg    Okay to refill Humira?

## 2022-06-28 NOTE — Progress Notes (Signed)
Office Visit Note  Patient: Bryan Sanders             Date of Birth: [birth year]           MRN: [id number]             PCP: Gaynelle Arabian, MD Referring: Gaynelle Arabian, MD Visit Date: 07/12/2022 Occupation: @GUAROCC @  Subjective:  Medication monitoring   History of Present Illness: Bryan Sanders is a 61 y.o. male with history of psoriatic arthritis. He will remain on Humira 40 mg sq injections every 14 days.  Patient was started on Humira on 03/07/2022.  He has been tolerating Humira without any side effects or injection site reactions.  He has not missed any doses of Humira recently.  He states that the day prior to his Humira injections he notices some increased joint stiffness but has not had any recent flares.  He states that psoriasis on his palms has improved.  He is having less generalized soreness and has been able to be more mobile.  He is having less joint stiffness on a daily basis.  He states overall he has noticed about a 60% improvement in his symptoms.  He denies any recent or recurrent infections.  He denies any new medical conditions.   Activities of Daily Living:  Patient reports morning stiffness for 0 minutes.   Patient Denies nocturnal pain.  Difficulty dressing/grooming: Denies Difficulty climbing stairs: Denies Difficulty getting out of chair: Denies Difficulty using hands for taps, buttons, cutlery, and/or writing: Denies  Review of Systems  Constitutional:  Negative for fatigue.  HENT:  Positive for mouth dryness. Negative for mouth sores.   Eyes:  Negative for dryness.  Respiratory:  Negative for shortness of breath.   Cardiovascular:  Negative for chest pain and palpitations.  Gastrointestinal:  Negative for blood in stool, constipation and diarrhea.  Endocrine: Negative for increased urination.  Genitourinary:  Negative for involuntary urination.  Musculoskeletal:  Positive for joint pain and joint pain. Negative for gait problem, joint swelling, myalgias,  muscle weakness, morning stiffness, muscle tenderness and myalgias.  Skin:  Negative for color change, rash and sensitivity to sunlight.  Allergic/Immunologic: Negative for susceptible to infections.  Neurological:  Negative for dizziness and headaches.  Hematological:  Negative for swollen glands.  Psychiatric/Behavioral:  Negative for depressed mood and sleep disturbance. The patient is not nervous/anxious.     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   Osteoarthritis of knee 03/07/2022   Psoriasis 03/07/2022   Psoriatic arthritis ([place]) 03/07/2022   Pure hypercholesterolemia 03/07/2022   Obesity 07/21/2008   Obstructive sleep apnea 07/21/2008    Past Medical History:  Diagnosis Date   Ankylosis of lumbar spine    Hypertension    IBS (irritable bowel syndrome)    Pre-diabetes    Psoriasis    Sleep apnea    Spondylosis     Family History  Problem Relation Age of Onset   Diabetes Mother    Heart disease Mother    Diabetes Brother    Past Surgical History:  Procedure Laterality Date   APPENDECTOMY  09/09/1993   VASECTOMY     Social History   Social History Narrative   Not on file    There is no immunization history on  file for this patient.   Objective: Vital Signs: BP 122/70 (BP Location: Left Arm, Patient Position: Sitting, Cuff Size: Large)   Pulse (!) 54   Resp 18   Ht 5' 11"  (1.803 m)   Wt (!) 310 lb 3.2 oz (140.7 kg)   BMI 43.26 kg/m    Physical Exam Vitals and nursing note reviewed.  Constitutional:      Appearance: He is well-developed.  HENT:     Head: Normocephalic and atraumatic.  Eyes:     Conjunctiva/sclera: Conjunctivae normal.     Pupils: Pupils are equal, round, and reactive to light.   Cardiovascular:     Rate and Rhythm: Normal rate and regular rhythm.     Heart sounds: Normal heart sounds.  Pulmonary:     Effort: Pulmonary effort is normal.     Breath sounds: Normal breath sounds.  Abdominal:     General: Bowel sounds are normal.     Palpations: Abdomen is soft.  Musculoskeletal:     Cervical back: Normal range of motion and neck supple.  Skin:    General: Skin is warm and dry.     Capillary Refill: Capillary refill takes less than 2 seconds.     Comments: Faint, small scaling pustules noted on palmar aspect of both hands, R>Left-improving.  Neurological:     Mental Status: He is alert and oriented to person, place, and time.  Psychiatric:        Behavior: Behavior normal.      Musculoskeletal Exam: C-spine has limited range of motion.  Thoracic kyphosis.  Painful range of motion of the lumbar spine.  Some tenderness over both SI joints.  Shoulder joints, elbow joints, wrist joints, MCPs, PIPs, DIPs have good range of motion with no synovitis.  PIP and DIP thickening consistent with osteoarthritis of both hands.  Cottle joint prominence bilaterally.  Hip joints have limited range of motion.  Left knee has limited extension.  Right knee joint has good range of motion with no warmth or effusion.  Ankle joints have good range of motion no tenderness or synovitis.  No evidence of Achilles tendinitis or plantar fasciitis.  CDAI Exam: CDAI Score: -- Patient Global: --; Provider Global: -- Swollen: --; Tender: -- Joint Exam 07/12/2022   No joint exam has been documented for this visit   There is currently no information documented on the homunculus. Go to the Rheumatology activity and complete the homunculus joint exam.  Investigation: No additional findings.  Imaging: No results found.  Recent Labs: Lab Results  Component Value Date   WBC 6.9 04/12/2022   HGB 15.1 04/12/2022   PLT 166 04/12/2022   NA 140 04/12/2022   K 4.2 04/12/2022   CL 107 04/12/2022    CO2 25 04/12/2022   GLUCOSE 110 04/12/2022   BUN 16 04/12/2022   CREATININE 1.13 04/12/2022   BILITOT 0.4 04/12/2022   AST 19 04/12/2022   ALT 25 04/12/2022   PROT 6.7 04/12/2022   CALCIUM 9.1 04/12/2022   QFTBGOLDPLUS NEGATIVE 02/15/2022    Speciality Comments: No specialty comments available.  Procedures:  No procedures performed Allergies: Patient has no known allergies.   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  SI joint sclerosis. History of thoracic kyphosis for many years: He has not had any signs or symptoms of a sort of arthritis flare.  He has clinically been doing well on  Humira 40 mg subcu as injections every 14 days.  He initiated Humira on 03/07/2022.  He typically experiences some increased joint stiffness the day prior to his Humira injection.  He has not noticed any joint inflammation.  He has no Achilles tendinitis or plantar fasciitis.  He has some residual SI joint tenderness bilaterally.  The pustular psoriasis on his palms has improved significantly since initiating therapy.  He has no synovitis or dactylitis on examination today.  He has noticed about a 60% improvement in his joint pain, stiffness, and mobility since initiating Humira.  He will remain on Humira as prescribed.  He was advised to notify us if he develops increased breakthrough symptoms or recurrent flares.  He will follow-up in the office in 3 months or sooner if needed.  Psoriasis - Dx by dermatology-10 years ago.  Palmar pustular psoriasis-improving.  Psoriasis in his ear canals has resolved.  Psoriasis in the inguinal area has also resolved.  Patient will remain on Humira as prescribed.  High risk medication use - Humira 40 mg sq injections every 14 days.  Initiated humira on 03/07/22.  CBC and CMP updated on 05/10/22.  His next lab work will be due in December and every 3 months to monitor for drug  toxicity. TB gold negative on 02/15/22.  He has not had any recent or recurrent infections.  Discussed the importance of holding humira if he develops signs or symptoms of an infection and to resume once the infection has completely cleared.  Patient is still awaiting an appointment with dermatology for yearly skin examinations for skin cancer screening.  Primary osteoarthritis of left knee: He has slightly limited extension of the left knee.  He had a visco gel injection performed this morning at this orthopedics office.  He is planning on proceeding with a knee replacement within the next couple of years.   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  Thoracic kyphosis noted.  No midline spinal tenderness in the thoracic region at this time.  Bilateral sacroiliitis (HCC) - History of chronic gluteal pain.  X-rays showed bilateral sacroiliitis.  He has some residual SI joint tenderness bilaterally.  Other medical conditions are listed as follows:   Essential hypertension: BP was 122/70 today in the office.   History of diabetes mellitus  History of IBS  History of gastroesophageal reflux (GERD)  Obstructive sleep apnea  Family history of psoriasis- daughter  Orders: No orders of the defined types were placed in this encounter.  No orders of the defined types were placed in this encounter.     Follow-Up Instructions: Return in about 3 months (around 10/12/2022) for Psoriatic arthritis.   Ofilia Neas, PA-C  Note - This record has been created using Dragon software.  Chart creation errors have been sought, but may not always  have been located. Such creation errors do not reflect on  the standard of medical care.

## 2022-07-12 ENCOUNTER — Encounter: Payer: Self-pay | Admitting: Physician Assistant

## 2022-07-12 ENCOUNTER — Ambulatory Visit: Payer: 59 | Attending: Physician Assistant | Admitting: Physician Assistant

## 2022-07-12 VITALS — BP 122/70 | HR 54 | Resp 18 | Ht 71.0 in | Wt 310.2 lb

## 2022-07-12 DIAGNOSIS — L409 Psoriasis, unspecified: Secondary | ICD-10-CM

## 2022-07-12 DIAGNOSIS — M1712 Unilateral primary osteoarthritis, left knee: Secondary | ICD-10-CM

## 2022-07-12 DIAGNOSIS — Z8719 Personal history of other diseases of the digestive system: Secondary | ICD-10-CM

## 2022-07-12 DIAGNOSIS — Z840 Family history of diseases of the skin and subcutaneous tissue: Secondary | ICD-10-CM

## 2022-07-12 DIAGNOSIS — Z79899 Other long term (current) drug therapy: Secondary | ICD-10-CM | POA: Diagnosis not present

## 2022-07-12 DIAGNOSIS — G4733 Obstructive sleep apnea (adult) (pediatric): Secondary | ICD-10-CM

## 2022-07-12 DIAGNOSIS — I10 Essential (primary) hypertension: Secondary | ICD-10-CM

## 2022-07-12 DIAGNOSIS — M546 Pain in thoracic spine: Secondary | ICD-10-CM

## 2022-07-12 DIAGNOSIS — M461 Sacroiliitis, not elsewhere classified: Secondary | ICD-10-CM

## 2022-07-12 DIAGNOSIS — Z8639 Personal history of other endocrine, nutritional and metabolic disease: Secondary | ICD-10-CM

## 2022-07-12 DIAGNOSIS — L4050 Arthropathic psoriasis, unspecified: Secondary | ICD-10-CM | POA: Diagnosis not present

## 2022-07-12 NOTE — Patient Instructions (Signed)
Standing Labs We placed an order today for your standing lab work.   Please have your standing labs drawn in December and every 3 months   Please have your labs drawn 2 weeks prior to your appointment so that the provider can discuss your lab results at your appointment.  Please note that you may see your imaging and lab results in [facility] before we have reviewed them. We will contact you once all results are reviewed. Please allow our office up to 72 hours to thoroughly review all of the results before contacting the office for clarification of your results.  Lab hours are:   Monday through Thursday from 8:00 am -12:30 pm and 1:00 pm-5:00 pm and Friday from 8:00 am-12:00 pm.  Please be advised, all patients with office appointments requiring lab work will take precedent over walk-in lab work.   Labs are drawn by [facility]. Please bring your co-pay at the time of your lab draw.  You may receive a bill from [facility] for your lab work.  Please note if you are on Hydroxychloroquine and and an order has been placed for a Hydroxychloroquine level, you will need to have it drawn 4 hours or more after your last dose.  If you wish to have your labs drawn at another location, please call the office 24 hours in advance so we can fax the orders.  The office is located at [address], [place], [place], [place] [address] No appointment is necessary.    If you have any questions regarding directions or hours of operation,  please call [phone].   As a reminder, please drink plenty of water prior to coming for your lab work. Thanks!

## 2022-08-16 ENCOUNTER — Other Ambulatory Visit: Payer: Self-pay | Admitting: Physician Assistant

## 2022-08-16 DIAGNOSIS — Z79899 Other long term (current) drug therapy: Secondary | ICD-10-CM

## 2022-08-16 DIAGNOSIS — L4050 Arthropathic psoriasis, unspecified: Secondary | ICD-10-CM

## 2022-08-16 DIAGNOSIS — L409 Psoriasis, unspecified: Secondary | ICD-10-CM

## 2022-08-16 NOTE — Telephone Encounter (Signed)
Next Visit: 10/18/2022  Last Visit: 07/12/2022  Last Fill: 05/27/2022  NA:[id number] arthritis   Current Dose per office note on 07/12/2022: Humira 40 mg sq injections every 14 days.    Labs: 05/10/2022  TB Gold: 02/15/2022 negative   Advised patient that he is due to update labs, patient states he will update next week. Lab orders are in place.   Okay to refill humira?

## 2022-08-30 ENCOUNTER — Other Ambulatory Visit: Payer: Self-pay | Admitting: *Deleted

## 2022-08-30 DIAGNOSIS — Z79899 Other long term (current) drug therapy: Secondary | ICD-10-CM

## 2022-08-30 DIAGNOSIS — L409 Psoriasis, unspecified: Secondary | ICD-10-CM

## 2022-08-30 DIAGNOSIS — L4050 Arthropathic psoriasis, unspecified: Secondary | ICD-10-CM

## 2022-08-31 LAB — COMPLETE METABOLIC PANEL WITH GFR
AG Ratio: 1.7 (calc) (ref 1.0–2.5)
ALT: 24 U/L (ref 9–46)
AST: 20 U/L (ref 10–35)
Albumin: 4.1 g/dL (ref 3.6–5.1)
Alkaline phosphatase (APISO): 55 U/L (ref 35–144)
BUN/Creatinine Ratio: 15 (calc) (ref 6–22)
BUN: 20 mg/dL (ref 7–25)
CO2: 28 mmol/L (ref 20–32)
Calcium: 8.9 mg/dL (ref 8.6–10.3)
Chloride: 107 mmol/L (ref 98–110)
Creat: 1.37 mg/dL — ABNORMAL HIGH (ref 0.70–1.35)
Globulin: 2.4 g/dL (calc) (ref 1.9–3.7)
Glucose, Bld: 149 mg/dL — ABNORMAL HIGH (ref 65–99)
Potassium: 4.4 mmol/L (ref 3.5–5.3)
Sodium: 142 mmol/L (ref 135–146)
Total Bilirubin: 0.4 mg/dL (ref 0.2–1.2)
Total Protein: 6.5 g/dL (ref 6.1–8.1)
eGFR: 59 mL/min/{1.73_m2} — ABNORMAL LOW (ref >=60)

## 2022-08-31 LAB — CBC WITH DIFFERENTIAL/PLATELET
Absolute Monocytes: 437 cells/uL (ref 200–950)
Basophils Absolute: 28 cells/uL (ref 0–200)
Basophils Relative: 0.5 %
Eosinophils Absolute: 241 cells/uL (ref 15–500)
Eosinophils Relative: 4.3 %
HCT: 45.3 % (ref 38.5–50.0)
Hemoglobin: 15.6 g/dL (ref 13.2–17.1)
Lymphs Abs: 1719 cells/uL (ref 850–3900)
MCH: 32.2 pg (ref 27.0–33.0)
MCHC: 34.4 g/dL (ref 32.0–36.0)
MCV: 93.6 fL (ref 80.0–100.0)
MPV: 9 fL (ref 7.5–12.5)
Monocytes Relative: 7.8 %
Neutro Abs: 3175 cells/uL (ref 1500–7800)
Neutrophils Relative %: 56.7 %
Platelets: 164 10*3/uL (ref 140–400)
RBC: 4.84 10*6/uL (ref 4.20–5.80)
RDW: 11.9 % (ref 11.0–15.0)
Total Lymphocyte: 30.7 %
WBC: 5.6 10*3/uL (ref 3.8–10.8)

## 2022-09-05 ENCOUNTER — Encounter: Payer: Self-pay | Admitting: Rheumatology

## 2022-09-05 ENCOUNTER — Other Ambulatory Visit: Payer: Self-pay | Admitting: *Deleted

## 2022-09-05 DIAGNOSIS — L409 Psoriasis, unspecified: Secondary | ICD-10-CM

## 2022-09-05 DIAGNOSIS — L4050 Arthropathic psoriasis, unspecified: Secondary | ICD-10-CM

## 2022-09-05 DIAGNOSIS — Z79899 Other long term (current) drug therapy: Secondary | ICD-10-CM

## 2022-09-17 ENCOUNTER — Other Ambulatory Visit (HOSPITAL_COMMUNITY): Payer: Self-pay

## 2022-09-17 MED ORDER — PREDNISONE 20 MG PO TABS
20.0000 mg | ORAL_TABLET | Freq: Two times a day (BID) | ORAL | 0 refills | Status: AC
  Filled 2022-09-17: qty 10, 5d supply, fill #0

## 2022-09-17 MED ORDER — DOXYCYCLINE HYCLATE 100 MG PO CAPS
100.0000 mg | ORAL_CAPSULE | Freq: Two times a day (BID) | ORAL | 0 refills | Status: AC
  Filled 2022-09-17: qty 20, 10d supply, fill #0

## 2022-09-18 ENCOUNTER — Encounter: Payer: Self-pay | Admitting: Rheumatology

## 2022-09-25 ENCOUNTER — Telehealth: Payer: Self-pay | Admitting: *Deleted

## 2022-09-25 NOTE — Telephone Encounter (Signed)
Received fax from [facility].  [facility] states they have been trying to reach patient for refill on Humira. They have been unsuccessful. Patient advised to contact [facility] to set up shipment.

## 2022-10-04 NOTE — Progress Notes (Signed)
Office Visit Note  Patient: Bryan Sanders             Date of Birth: 05-30-1961           MRN: [id number]             PCP: Gaynelle Arabian, MD Referring: Gaynelle Arabian, MD Visit Date: 10/18/2022 Occupation: @GUAROCC$ @  Subjective:  Chronic left knee pain   History of Present Illness: Bryan Sanders is a 62 y.o. male with history of psoriatic arthritis and osteoarthritis.  Patient remains on Humira 40 mg sq injections every 14 days.  He has been tolerating Humira without a new side effects or injection site reactions.  Patient reports that he was recently diagnosed with prepatellar bursitis of the right knee.  Patient was treated with doxycycline x 10 days which resolved his symptoms.  He denies any Achilles tendinitis or plantar fasciitis.  He denies any SI joint pain.  He continues to have chronic pain in the left knee joint.  He is planning on proceeding with a left total knee arthroplasty in August 2024 which will be performed by Dr. Ronnie Derby.  He is aware that he will need to hold Humira prior to surgery and to be cleared by Dr. Ronnie Derby during the postoperative period prior to resuming.  He has been taking Tylenol as needed for pain relief. Patient has some psoriasis on the plantar aspect of his right palm and has been using topical agents as needed.  He states that his skin has been more dry since he has been using hand sanitizer more frequently while at work.  He denies any other new patches of psoriasis at this time. He denies any recurrent infections.  He has been taking elderberry daily.  He had the annual flu shot.     Activities of Daily Living:  Patient reports morning stiffness for 10 minutes.   Patient Denies nocturnal pain.  Difficulty dressing/grooming: Denies Difficulty climbing stairs: Reports Difficulty getting out of chair: Denies Difficulty using hands for taps, buttons, cutlery, and/or writing: Denies  Review of Systems  Constitutional: Negative.  Negative for fatigue.   HENT: Negative.  Negative for mouth sores and mouth dryness.   Eyes: Negative.  Negative for dryness.  Respiratory: Negative.  Negative for shortness of breath.   Cardiovascular: Negative.  Negative for chest pain and palpitations.  Gastrointestinal: Negative.  Negative for blood in stool, constipation and diarrhea.  Endocrine: Positive for increased urination.  Genitourinary: Negative.  Negative for involuntary urination.  Musculoskeletal:  Positive for joint pain, joint pain, joint swelling and morning stiffness. Negative for gait problem, myalgias, muscle weakness, muscle tenderness and myalgias.  Skin: Negative.  Negative for pallor, rash, hair loss and sensitivity to sunlight.  Allergic/Immunologic: Negative.  Negative for susceptible to infections.  Neurological: Negative.  Negative for dizziness and headaches.  Hematological: Negative.  Negative for swollen glands.  Psychiatric/Behavioral:  Positive for sleep disturbance. Negative for depressed mood. The patient is not nervous/anxious.     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   Osteoarthritis of knee 03/07/2022   Psoriasis 03/07/2022   Psoriatic arthritis ([place]) 03/07/2022   Pure hypercholesterolemia 03/07/2022   Obesity 07/21/2008   Obstructive sleep apnea 07/21/2008    Past Medical History:  Diagnosis Date  Ankylosis of lumbar spine    Hypertension    IBS (irritable bowel syndrome)    Pre-diabetes    Psoriasis    Sleep apnea    Spondylosis     Family History  Problem Relation Age of Onset   Diabetes Mother    Heart disease Mother    Diabetes Brother    Past Surgical History:  Procedure Laterality Date   APPENDECTOMY  09/09/1993   VASECTOMY     Social History    Social History Narrative   Not on file    There is no immunization history on file for this patient.   Objective: Vital Signs: BP (!) 105/54 (BP Location: Left Arm, Patient Position: Sitting, Cuff Size: Large)   Pulse (!) 56   Resp 16   Ht 5' 11"$  (1.803 m)   Wt (!) 308 lb 3.2 oz (139.8 kg)   BMI 42.99 kg/m    Physical Exam Vitals and nursing note reviewed.  Constitutional:      Appearance: He is well-developed.  HENT:     Head: Normocephalic and atraumatic.  Eyes:     Conjunctiva/sclera: Conjunctivae normal.     Pupils: Pupils are equal, round, and reactive to light.  Cardiovascular:     Rate and Rhythm: Normal rate and regular rhythm.     Heart sounds: Normal heart sounds.  Pulmonary:     Effort: Pulmonary effort is normal.     Breath sounds: Normal breath sounds.  Abdominal:     General: Bowel sounds are normal.     Palpations: Abdomen is soft.  Musculoskeletal:     Cervical back: Normal range of motion and neck supple.  Skin:    General: Skin is warm and dry.     Capillary Refill: Capillary refill takes less than 2 seconds.     Comments: Dry scaling on the palmar aspect of the right palm noted.   Neurological:     Mental Status: He is alert and oriented to person, place, and time.  Psychiatric:        Behavior: Behavior normal.      Musculoskeletal Exam: C-spine has limited range of motion.  Thoracic kyphosis noted.  No midline spinal tenderness or SI joint tenderness currently.  Shoulder joints, elbow joints, wrist joints, MCPs, PIPs, DIPs have good range of motion with no synovitis.  PIP and DIP thickening consistent with osteoarthritis of both hands.  Complete fist formation bilaterally.  Hip joints have slightly limited range of motion but no groin pain currently.  Left knee has limited extension and warmth.  Right knee joint has good range of motion with no warmth or effusion.  Ankle joints have good range of motion with no tenderness or joint swelling.  No  evidence of Achilles tendinitis or plantar fasciitis.  CDAI Exam: CDAI Score: -- Patient Global: --; Provider Global: -- Swollen: --; Tender: -- Joint Exam 10/18/2022   No joint exam has been documented for this visit   There is currently no information documented on the homunculus. Go to the Rheumatology activity and complete the homunculus joint exam.  Investigation: No additional findings.  Imaging: No results found.  Recent Labs: Lab Results  Component Value Date   WBC 5.6 08/30/2022   HGB 15.6 08/30/2022   PLT 164 08/30/2022   NA 142 08/30/2022   K 4.4 08/30/2022   CL 107 08/30/2022   CO2 28 08/30/2022   GLUCOSE 149 (H) 08/30/2022   BUN 20 08/30/2022   CREATININE 1.37 (H)  08/30/2022   BILITOT 0.4 08/30/2022   AST 20 08/30/2022   ALT 24 08/30/2022   PROT 6.5 08/30/2022   CALCIUM 8.9 08/30/2022   QFTBGOLDPLUS NEGATIVE 02/15/2022    Speciality Comments: No specialty comments available.  Procedures:  No procedures performed Allergies: Patient has no known allergies.   Patient was evaluated at [facility] in [place] on 09/17/2022 for acute right knee pain and swelling which started on 09/15/2022.  He was diagnosed with prepatellar bursitis and questionable cellulitis.  He was given a prescription for doxycycline which she took for 10 days which resolved his symptoms.  He held Humira for about 2 weeks during that time.  He has not had any recurrence of symptoms.    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  SI joint sclerosis. History of thoracic kyphosis for many years: He has no synovitis or dactylitis on examination today.  No SI joint tenderness upon palpation.  No evidence of Achilles tendinitis or plantar fasciitis.  He remains on Humira 40 mg sq injections every 14 days.  He is no longer taking meloxicam and has been taking Tylenol very sparingly  for pain relief.  He will remain on Humira as monotherapy.  He was advised to notify us if he develops signs or symptoms of a flare.  Follow-up in the office in 5 months or sooner if needed.  Psoriasis - Dx by dermatology-10 years ago.  Palmar pustular psoriasis-improving.Psoriasis in his ear canals has resolved.  Psoriasis in the inguinal area has also resolved.  Dry scaling noted on the palmar aspect of the right hand.  His symptoms were exacerbated by using hand sanitizer more frequently throughout the day while at work.  He has been trying to moisturize at night.  He will remain on Humira as prescribed.  High risk medication use - Humira 40 mg sq injections every 14 days.  Initiated humira on 03/07/22.  TB Gold negative on 02/15/2022. CBC and CMP were drawn on 08/30/2022. BMP with GFR updated today.  Advised patient to avoid NSAID use including meloxicam. Discussed the importance of holding Humira if he develops signs or symptoms of an infection and to resume once the infection has completely cleared. He received the annual flu shot. - Plan: BASIC METABOLIC PANEL WITH GFR  Elevated serum creatinine -Creatinine was 1.37 and GFR was 59 on 08/30/2022.  BMP with GFR updated today.  Discussed the importance of avoiding the use of meloxicam and all other NSAIDs.  Plan: BASIC METABOLIC PANEL WITH GFR  Primary osteoarthritis of left knee: Chronic pain.  Warmth and limited extension noted.  Patient is considering proceeding with a left total knee arthroplasty in August 2024 which will be performed by Dr. Ronnie Derby.  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  Thoracic kyphosis noted.  No midline spinal tenderness currently.  Bilateral sacroiliitis (HCC) - History of chronic gluteal pain.  X-rays showed bilateral sacroiliitis.  No SI joint tenderness upon palpation today.  History of cellulitis: Patient was evaluated on 09/17/2022 by his orthopedist and at that time was diagnosed with prepatellar bursitis as well  as cellulitis.  He was given a prescription for doxycycline which she took for 10 days which resolved his symptoms.  He held Humira for 2 weeks during that time.  He has not had any recurrence.  Other medical conditions are listed as follows:  History of diabetes mellitus  Essential hypertension: Blood pressure was 105/54 today in the office.  History of gastroesophageal reflux (GERD)  History of IBS  Obstructive sleep apnea  Family history of psoriasis- daughter   Orders: Orders Placed This Encounter  Procedures   BASIC METABOLIC PANEL WITH GFR   No orders of the defined types were placed in this encounter.     Follow-Up Instructions: Return in about 5 months (around 03/18/2023) for Psoriatic arthritis, Osteoarthritis.   Ofilia Neas, PA-C  Note - This record has been created using Dragon software.  Chart creation errors have been sought, but may not always  have been located. Such creation errors do not reflect on  the standard of medical care.

## 2022-10-18 ENCOUNTER — Ambulatory Visit: Payer: 59 | Attending: Physician Assistant | Admitting: Physician Assistant

## 2022-10-18 ENCOUNTER — Encounter: Payer: Self-pay | Admitting: Physician Assistant

## 2022-10-18 VITALS — BP 105/54 | HR 56 | Resp 16 | Ht 71.0 in | Wt 308.2 lb

## 2022-10-18 DIAGNOSIS — L4050 Arthropathic psoriasis, unspecified: Secondary | ICD-10-CM | POA: Diagnosis not present

## 2022-10-18 DIAGNOSIS — G4733 Obstructive sleep apnea (adult) (pediatric): Secondary | ICD-10-CM

## 2022-10-18 DIAGNOSIS — Z79899 Other long term (current) drug therapy: Secondary | ICD-10-CM

## 2022-10-18 DIAGNOSIS — M1712 Unilateral primary osteoarthritis, left knee: Secondary | ICD-10-CM | POA: Diagnosis not present

## 2022-10-18 DIAGNOSIS — Z840 Family history of diseases of the skin and subcutaneous tissue: Secondary | ICD-10-CM

## 2022-10-18 DIAGNOSIS — R7989 Other specified abnormal findings of blood chemistry: Secondary | ICD-10-CM

## 2022-10-18 DIAGNOSIS — Z8719 Personal history of other diseases of the digestive system: Secondary | ICD-10-CM

## 2022-10-18 DIAGNOSIS — Z8639 Personal history of other endocrine, nutritional and metabolic disease: Secondary | ICD-10-CM

## 2022-10-18 DIAGNOSIS — Z872 Personal history of diseases of the skin and subcutaneous tissue: Secondary | ICD-10-CM

## 2022-10-18 DIAGNOSIS — M546 Pain in thoracic spine: Secondary | ICD-10-CM

## 2022-10-18 DIAGNOSIS — L409 Psoriasis, unspecified: Secondary | ICD-10-CM | POA: Diagnosis not present

## 2022-10-18 DIAGNOSIS — M461 Sacroiliitis, not elsewhere classified: Secondary | ICD-10-CM

## 2022-10-18 DIAGNOSIS — I10 Essential (primary) hypertension: Secondary | ICD-10-CM

## 2022-10-18 NOTE — Patient Instructions (Signed)
Standing Labs We placed an order today for your standing lab work.   Please have your standing labs drawn in March and every 3 months  Please have your labs drawn 2 weeks prior to your appointment so that the provider can discuss your lab results at your appointment.  Please note that you may see your imaging and lab results in MyChart before we have reviewed them. We will contact you once all results are reviewed. Please allow our office up to 72 hours to thoroughly review all of the results before contacting the office for clarification of your results.  Lab hours are:   Monday through Thursday from 8:00 am -12:30 pm and 1:00 pm-5:00 pm and Friday from 8:00 am-12:00 pm.  Please be advised, all patients with office appointments requiring lab work will take precedent over walk-in lab work.   Labs are drawn by [facility]. Please bring your co-pay at the time of your lab draw.  You may receive a bill from [facility] for your lab work.  Please note if you are on Hydroxychloroquine and and an order has been placed for a Hydroxychloroquine level, you will need to have it drawn 4 hours or more after your last dose.  If you wish to have your labs drawn at another location, please call the office 24 hours in advance so we can fax the orders.  The office is located at [address], [address], [place], [place] [address] No appointment is necessary.    If you have any questions regarding directions or hours of operation,  please call [phone].   As a reminder, please drink plenty of water prior to coming for your lab work. Thanks!  

## 2022-10-19 LAB — BASIC METABOLIC PANEL WITH GFR
BUN: 22 mg/dL (ref 7–25)
CO2: 24 mmol/L (ref 20–32)
Calcium: 9.1 mg/dL (ref 8.6–10.3)
Chloride: 105 mmol/L (ref 98–110)
Creat: 1.29 mg/dL (ref 0.70–1.35)
Glucose, Bld: 126 mg/dL (ref 65–139)
Potassium: 4.4 mmol/L (ref 3.5–5.3)
Sodium: 139 mmol/L (ref 135–146)
eGFR: 63 mL/min/{1.73_m2} (ref >=60)

## 2022-10-21 NOTE — Progress Notes (Signed)
BMP With GFR WNL

## 2022-11-23 ENCOUNTER — Other Ambulatory Visit: Payer: Self-pay | Admitting: Rheumatology

## 2022-11-23 DIAGNOSIS — Z79899 Other long term (current) drug therapy: Secondary | ICD-10-CM

## 2022-11-23 DIAGNOSIS — L409 Psoriasis, unspecified: Secondary | ICD-10-CM

## 2022-11-23 DIAGNOSIS — L4050 Arthropathic psoriasis, unspecified: Secondary | ICD-10-CM

## 2022-11-25 NOTE — Telephone Encounter (Signed)
Next Visit: 03/21/2023  Last Visit: 10/18/2022  Last Fill: 08/16/2022  DX: Psoriatic arthritis   Current Dose per office note 10/18/2022: Humira 40 mg sq injections every 14 days.   Labs: 08/30/2022 CBC WNL. Glucose is elevated-149. Creatinine is elevated-1.49 and GFR is borderline low-59  TB Gold: 02/15/2022 Neg    Patient advised he is due to update labs. Patient states he will come in Friday to update labs.  Okay to refill Humira?

## 2022-11-29 ENCOUNTER — Other Ambulatory Visit (HOSPITAL_COMMUNITY): Payer: Self-pay

## 2022-11-29 ENCOUNTER — Other Ambulatory Visit: Payer: Self-pay | Admitting: *Deleted

## 2022-11-29 DIAGNOSIS — L4050 Arthropathic psoriasis, unspecified: Secondary | ICD-10-CM

## 2022-11-29 DIAGNOSIS — Z79899 Other long term (current) drug therapy: Secondary | ICD-10-CM

## 2022-11-29 DIAGNOSIS — L409 Psoriasis, unspecified: Secondary | ICD-10-CM

## 2022-11-29 MED ORDER — GLIMEPIRIDE 2 MG PO TABS
2.0000 mg | ORAL_TABLET | Freq: Every day | ORAL | 4 refills | Status: AC
  Filled 2022-11-29: qty 30, 30d supply, fill #0

## 2022-11-29 MED ORDER — LISINOPRIL 40 MG PO TABS
40.0000 mg | ORAL_TABLET | Freq: Every day | ORAL | 4 refills | Status: AC
  Filled 2022-11-29: qty 30, 30d supply, fill #0

## 2022-11-30 LAB — CBC WITH DIFFERENTIAL/PLATELET
Absolute Monocytes: 538 cells/uL (ref 200–950)
Basophils Absolute: 32 cells/uL (ref 0–200)
Basophils Relative: 0.5 %
Eosinophils Absolute: 230 cells/uL (ref 15–500)
Eosinophils Relative: 3.6 %
HCT: 43.7 % (ref 38.5–50.0)
Hemoglobin: 14.8 g/dL (ref 13.2–17.1)
Lymphs Abs: 1990 cells/uL (ref 850–3900)
MCH: 31 pg (ref 27.0–33.0)
MCHC: 33.9 g/dL (ref 32.0–36.0)
MCV: 91.4 fL (ref 80.0–100.0)
MPV: 9.1 fL (ref 7.5–12.5)
Monocytes Relative: 8.4 %
Neutro Abs: 3610 cells/uL (ref 1500–7800)
Neutrophils Relative %: 56.4 %
Platelets: 165 10*3/uL (ref 140–400)
RBC: 4.78 10*6/uL (ref 4.20–5.80)
RDW: 13.1 % (ref 11.0–15.0)
Total Lymphocyte: 31.1 %
WBC: 6.4 10*3/uL (ref 3.8–10.8)

## 2022-11-30 LAB — COMPLETE METABOLIC PANEL WITH GFR
AG Ratio: 1.6 (calc) (ref 1.0–2.5)
ALT: 22 U/L (ref 9–46)
AST: 18 U/L (ref 10–35)
Albumin: 4.2 g/dL (ref 3.6–5.1)
Alkaline phosphatase (APISO): 55 U/L (ref 35–144)
BUN/Creatinine Ratio: 20 (calc) (ref 6–22)
BUN: 27 mg/dL — ABNORMAL HIGH (ref 7–25)
CO2: 27 mmol/L (ref 20–32)
Calcium: 9 mg/dL (ref 8.6–10.3)
Chloride: 103 mmol/L (ref 98–110)
Creat: 1.33 mg/dL (ref 0.70–1.35)
Globulin: 2.6 g/dL (calc) (ref 1.9–3.7)
Glucose, Bld: 95 mg/dL (ref 65–99)
Potassium: 4.8 mmol/L (ref 3.5–5.3)
Sodium: 139 mmol/L (ref 135–146)
Total Bilirubin: 0.6 mg/dL (ref 0.2–1.2)
Total Protein: 6.8 g/dL (ref 6.1–8.1)
eGFR: 61 mL/min/{1.73_m2} (ref >=60)

## 2022-12-16 ENCOUNTER — Other Ambulatory Visit: Payer: Self-pay | Admitting: Physician Assistant

## 2022-12-16 DIAGNOSIS — Z79899 Other long term (current) drug therapy: Secondary | ICD-10-CM

## 2022-12-16 DIAGNOSIS — L409 Psoriasis, unspecified: Secondary | ICD-10-CM

## 2022-12-16 DIAGNOSIS — L4050 Arthropathic psoriasis, unspecified: Secondary | ICD-10-CM

## 2022-12-16 NOTE — Telephone Encounter (Signed)
Last Fill: 11/25/2022 (30 day supply)  Labs: 11/29/2022 CBC WNL. BUN is slightly elevated. Creatinine is WNL.  GFR is >60.  Rest of CMP WNL  TB Gold: 02/15/2022 Neg    Next Visit: 03/21/2023  Last Visit: 10/18/2022  ZO:XWRUEAVWU arthritis   Current Dose per office note 03/21/2023: Humira 40 mg sq injections every 14 days.   Okay to refill Humira?

## 2023-02-07 ENCOUNTER — Telehealth: Payer: Self-pay | Admitting: Pharmacist

## 2023-02-07 NOTE — Telephone Encounter (Signed)
Submitted a Prior Authorization renewal request to [facility] for HUMIRA via CoverMyMeds. Will update once we receive a response.  Key: [id number]

## 2023-02-07 NOTE — Telephone Encounter (Signed)
Received notification from [facility] regarding a prior authorization for HUMIRA. Authorization has been APPROVED from 02/07/23 to 02/07/24. Approval letter sent to scan center.  Patient must continue to fill through [facility]: [phone]   Authorization #  [id number] Phone # [phone]  Chesley Mires, PharmD, MPH, BCPS, CPP Clinical Pharmacist (Rheumatology and Pulmonology)

## 2023-03-07 NOTE — Progress Notes (Signed)
Office Visit Note  Patient: Bryan Sanders             Date of Birth: [birth year]           MRN: [id number]             PCP: Blair Heys, MD Referring: Blair Heys, MD Visit Date: 03/21/2023 Occupation: @GUAROCC @  Subjective:  Medication monitoring   History of Present Illness: Bryan Sanders is a 62 y.o. male with history of psoriatic arthritis and osteoarthritis.  Patient remains on  Humira 40 mg sq injections every 14 days.  His last dose of Humira was administered on Tuesday of this week.  He states that on Wednesday he was diagnosed with cellulitis of his right lower leg.  He is currently taking Keflex.  Patient states that and has noticed improvement in the cellulitis.  He is no longer having any fevers or chills and states the redness and warmth is subsiding.  Patient has an upcoming appointment with dermatology next week for a total body exam.  He denies any psoriasis at this time.  Patient states that his psoriatic arthritis remains well-controlled on Humira.  He denies any signs or symptoms of a flare.  Patient states that about 2 days prior to his Humira dose he noticed increased soreness and stiffness but has not noticed any joint swelling.  He denies any Achilles tendinitis or plantar fasciitis.  He denies any SI joint pain.  Activities of Daily Living:  Patient reports morning stiffness for 0  none .   Patient Denies nocturnal pain.  Difficulty dressing/grooming: Denies Difficulty climbing stairs: Denies Difficulty getting out of chair: Denies Difficulty using hands for taps, buttons, cutlery, and/or writing: Denies  Review of Systems  Constitutional:  Negative for fatigue.  HENT:  Positive for mouth dryness. Negative for mouth sores.   Eyes:  Negative for dryness.  Respiratory:  Negative for shortness of breath.   Cardiovascular:  Negative for chest pain and palpitations.  Gastrointestinal:  Negative for blood in stool, constipation and diarrhea.  Endocrine: Negative  for increased urination.  Genitourinary:  Negative for involuntary urination.  Musculoskeletal:  Positive for joint pain, joint pain, joint swelling, myalgias, muscle tenderness and myalgias. Negative for gait problem and muscle weakness.  Skin:  Positive for redness and sensitivity to sunlight. Negative for color change, rash and hair loss.  Allergic/Immunologic: Negative for susceptible to infections.  Neurological:  Positive for dizziness. Negative for headaches.  Hematological:  Negative for swollen glands.  Psychiatric/Behavioral:  Positive for sleep disturbance. Negative for depressed mood. The patient is not nervous/anxious.     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   Osteoarthritis of knee 03/07/2022   Psoriasis 03/07/2022   Psoriatic arthritis (HCC) 03/07/2022   Pure hypercholesterolemia 03/07/2022   Obesity 07/21/2008   Obstructive sleep apnea 07/21/2008    Past Medical History:  Diagnosis Date   Ankylosis of lumbar spine    Cellulitis    Hypertension    IBS (irritable bowel syndrome)    Pre-diabetes    Psoriasis    Sleep apnea    Spondylosis     Family History  Problem Relation Age of Onset   Diabetes Mother    Heart disease Mother    Diabetes Brother  Past Surgical History:  Procedure Laterality Date   APPENDECTOMY  09/09/1993   VASECTOMY     Social History   Social History Narrative   Not on file    There is no immunization history on file for this patient.   Objective: Vital Signs: BP 119/69 (BP Location: Left Arm, Patient Position: Sitting, Cuff Size: Normal)   Pulse (!) 52   Resp 16   Ht 5\' 11"  (1.803 m)   Wt (!) 304 lb (137.9 kg)   BMI 42.40 kg/m    Physical Exam Vitals and nursing note reviewed.   Constitutional:      Appearance: He is well-developed.  HENT:     Head: Normocephalic and atraumatic.  Eyes:     Conjunctiva/sclera: Conjunctivae normal.     Pupils: Pupils are equal, round, and reactive to light.  Cardiovascular:     Rate and Rhythm: Normal rate and regular rhythm.     Heart sounds: Normal heart sounds.  Pulmonary:     Effort: Pulmonary effort is normal.     Breath sounds: Normal breath sounds.  Abdominal:     General: Bowel sounds are normal.     Palpations: Abdomen is soft.  Musculoskeletal:     Cervical back: Normal range of motion and neck supple.  Skin:    General: Skin is warm and dry.     Capillary Refill: Capillary refill takes less than 2 seconds.     Comments: Mild erythema noted on right lower leg superior to ankle joint on the anteromedial aspect  Neurological:     Mental Status: He is alert and oriented to person, place, and time.  Psychiatric:        Behavior: Behavior normal.      Musculoskeletal Exam: C-spine limited ROM.  Postural thoracic kyphosis.  Limited mobility of lumbar spine.  No midline spinal tenderness.  No SI joint tenderness.  Shoulder joints, elbow joints, wrist joints, MCPs, PIPs, and DIPs good ROM with no synovitis.  Complete fist formation bilaterally.  PIP and DIP thickening.  Hip joints have limited ROM but no groin pain. Painful and limited extension of the left knee. Warmth of the left knee noted. Ankle joints have good ROM with no tenderness or joint swelling.  No evidence of achilles tendonitis or plantar fasciitis.   CDAI Exam: CDAI Score: -- Patient Global: --; Provider Global: -- Swollen: --; Tender: -- Joint Exam 03/21/2023   No joint exam has been documented for this visit   There is currently no information documented on the homunculus. Go to the Rheumatology activity and complete the homunculus joint exam.  Investigation: No additional findings.  Imaging: No results found.  Recent Labs: Lab Results   Component Value Date   WBC 6.4 11/29/2022   HGB 14.8 11/29/2022   PLT 165 11/29/2022   NA 139 11/29/2022   K 4.8 11/29/2022   CL 103 11/29/2022   CO2 27 11/29/2022   GLUCOSE 95 11/29/2022   BUN 27 (H) 11/29/2022   CREATININE 1.33 11/29/2022   BILITOT 0.6 11/29/2022   AST 18 11/29/2022   ALT 22 11/29/2022   PROT 6.8 11/29/2022   CALCIUM 9.0 11/29/2022   QFTBGOLDPLUS NEGATIVE 02/15/2022    Speciality Comments: No specialty comments available.  Procedures:  No procedures performed Allergies: Patient has no known allergies.    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  SI joint sclerosis. History of thoracic kyphosis for many years: He has no synovitis or dactylitis on examination today.  No SI joint tenderness upon palpation.  No evidence of Achilles tendinitis or plantar fasciitis.  The possible psoriasis on his palms has resolved.  Patient remains on Humira 40 mg subcutaneous injections every 14 days.  He is tolerating Humira without any side effects or injection site reactions.  He typically notices increased soreness and stiffness 1 to 2 days prior to injecting Humira but has not noticed any inflammation. Patient is currently being treated for cellulitis on his right lower leg with a course of Keflex.  Patient was advised to hold Humira until the cellulitis has completely resolved and he has completed the course of Keflex.  He has an upcoming appointment with dermatology next week. He was advised to notify us if he develops signs or symptoms of a flare.  He will follow-up in the office in 5 months or sooner if needed.  Psoriasis - Dx by dermatology-10 years ago.  Palmar pustular psoriasis-resolved. Psoriasis in his ear canals has resolved. Upcoming appointment with dermatology next week.  He will remain on humira as prescribed.  High risk medication use -  Humira 40 mg sq injections every 14 days. Initiated humira on 03/07/22.  CBC and CMP updated on 11/29/22. Orders for CBC and CMP released today.  His next lab work will be due in October and every 3 months.  TB gold negative on 02/15/22.  TB gold order released.  Discussed the importance of holding humira if he develops signs or symptoms of an infection and to resume once the infection has completely cleared.  Upcoming dermatology visit next week for skin cancer screening.   - Plan: COMPLETE METABOLIC PANEL WITH GFR, CBC with Differential/Platelet, QuantiFERON-TB Gold Plus  Screening for tuberculosis -Order for TB gold released today.  Plan: QuantiFERON-TB Gold Plus  Primary osteoarthritis of left knee: Chronic pain.  Limited extension and warmth.  Planning to proceed with a left knee replacement once his BMI is less than 40.    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  Thoracic kyphosis noted.  No midline spinal tenderness.  Bilateral sacroiliitis (HCC) - History of chronic gluteal pain.  X-rays showed bilateral sacroiliitis. No SI joint tenderness upon palpation.   Cellulitis of right lower extremity: Evaluated on Wednesday-diagnosed with cellulitis.  Currently taking keflex-improving. Advised patient to hold humira until the cellulitis has completely resolved and he has completed the course of keflex.   Other medical conditions are listed as follows:   History of diabetes mellitus  Essential hypertension: BP was 119/69 today in the office.   History of gastroesophageal reflux (GERD)  History of IBS  Obstructive sleep apnea  Family history of psoriasis- daughter  Elevated serum creatinine: Creatinine 1.33 and GFR 61 on 11/29/22.     Orders: Orders Placed This Encounter  Procedures   COMPLETE METABOLIC PANEL WITH GFR   CBC with Differential/Platelet   QuantiFERON-TB Gold Plus   No orders of the defined types were placed in this encounter.   Follow-Up Instructions: Return in  about 5 months (around 08/21/2023) for Psoriatic arthritis, Osteoarthritis.   Gearldine Bienenstock, PA-C  Note - This record has been created using Dragon software.  Chart creation errors have been sought, but may not always  have been located. Such creation errors do not reflect on  the standard of medical care.

## 2023-03-14 ENCOUNTER — Other Ambulatory Visit: Payer: Self-pay | Admitting: Physician Assistant

## 2023-03-14 DIAGNOSIS — Z111 Encounter for screening for respiratory tuberculosis: Secondary | ICD-10-CM

## 2023-03-14 DIAGNOSIS — Z79899 Other long term (current) drug therapy: Secondary | ICD-10-CM

## 2023-03-14 DIAGNOSIS — L4050 Arthropathic psoriasis, unspecified: Secondary | ICD-10-CM

## 2023-03-14 DIAGNOSIS — L409 Psoriasis, unspecified: Secondary | ICD-10-CM

## 2023-03-14 NOTE — Telephone Encounter (Signed)
Last Fill: 12/16/2022  Labs: 11/29/2022 CBC WNL. BUN is slightly elevated. Creatinine is WNL.  GFR is >60.  Rest of CMP WNL.  We will continue to monitor.    TB Gold: 02/16/2023 negative    Next Visit: 03/21/2023  Last Visit: 10/18/2022  ZO:XWRUEAVWU arthritis   Current Dose per office note on 10/18/2022: Humira 40 mg sq injections every 14 days.   Called patient and advised he is due to update labs. Patient will update at his appointment on 03/21/2023.  Okay to refill Humira?

## 2023-03-19 ENCOUNTER — Other Ambulatory Visit (HOSPITAL_COMMUNITY): Payer: Self-pay

## 2023-03-19 MED ORDER — CEPHALEXIN 500 MG PO CAPS
500.0000 mg | ORAL_CAPSULE | Freq: Four times a day (QID) | ORAL | 0 refills | Status: AC
  Filled 2023-03-19: qty 40, 10d supply, fill #0

## 2023-03-21 ENCOUNTER — Ambulatory Visit: Payer: 59 | Attending: Physician Assistant | Admitting: Physician Assistant

## 2023-03-21 ENCOUNTER — Encounter: Payer: Self-pay | Admitting: Physician Assistant

## 2023-03-21 VITALS — BP 119/69 | HR 52 | Resp 16 | Ht 71.0 in | Wt 304.0 lb

## 2023-03-21 DIAGNOSIS — M546 Pain in thoracic spine: Secondary | ICD-10-CM

## 2023-03-21 DIAGNOSIS — L4050 Arthropathic psoriasis, unspecified: Secondary | ICD-10-CM | POA: Diagnosis not present

## 2023-03-21 DIAGNOSIS — Z111 Encounter for screening for respiratory tuberculosis: Secondary | ICD-10-CM | POA: Diagnosis not present

## 2023-03-21 DIAGNOSIS — L409 Psoriasis, unspecified: Secondary | ICD-10-CM

## 2023-03-21 DIAGNOSIS — Z8639 Personal history of other endocrine, nutritional and metabolic disease: Secondary | ICD-10-CM

## 2023-03-21 DIAGNOSIS — Z79899 Other long term (current) drug therapy: Secondary | ICD-10-CM

## 2023-03-21 DIAGNOSIS — Z8719 Personal history of other diseases of the digestive system: Secondary | ICD-10-CM

## 2023-03-21 DIAGNOSIS — I10 Essential (primary) hypertension: Secondary | ICD-10-CM

## 2023-03-21 DIAGNOSIS — M1712 Unilateral primary osteoarthritis, left knee: Secondary | ICD-10-CM

## 2023-03-21 DIAGNOSIS — M461 Sacroiliitis, not elsewhere classified: Secondary | ICD-10-CM

## 2023-03-21 DIAGNOSIS — Z840 Family history of diseases of the skin and subcutaneous tissue: Secondary | ICD-10-CM

## 2023-03-21 DIAGNOSIS — Z872 Personal history of diseases of the skin and subcutaneous tissue: Secondary | ICD-10-CM

## 2023-03-21 DIAGNOSIS — R7989 Other specified abnormal findings of blood chemistry: Secondary | ICD-10-CM

## 2023-03-21 DIAGNOSIS — L03115 Cellulitis of right lower limb: Secondary | ICD-10-CM

## 2023-03-21 DIAGNOSIS — G4733 Obstructive sleep apnea (adult) (pediatric): Secondary | ICD-10-CM

## 2023-03-21 NOTE — Patient Instructions (Signed)
Standing Labs We placed an order today for your standing lab work.   Please have your standing labs drawn in October and every 3 months   Please have your labs drawn 2 weeks prior to your appointment so that the provider can discuss your lab results at your appointment, if possible.  Please note that you may see your imaging and lab results in MyChart before we have reviewed them. We will contact you once all results are reviewed. Please allow our office up to 72 hours to thoroughly review all of the results before contacting the office for clarification of your results.  WALK-IN LAB HOURS  Monday through Thursday from 8:00 am -12:30 pm and 1:00 pm-5:00 pm and Friday from 8:00 am-12:00 pm.  Patients with office visits requiring labs will be seen before walk-in labs.  You may encounter longer than normal wait times. Please allow additional time. Wait times may be shorter on  Monday and Thursday afternoons.  We do not book appointments for walk-in labs. We appreciate your patience and understanding with our staff.   Labs are drawn by [facility]. Please bring your co-pay at the time of your lab draw.  You may receive a bill from [facility] for your lab work.  Please note if you are on Hydroxychloroquine and and an order has been placed for a Hydroxychloroquine level,  you will need to have it drawn 4 hours or more after your last dose.  If you wish to have your labs drawn at another location, please call the office 24 hours in advance so we can fax the orders.  The office is located at [address], [address], [place], [place] [address]   If you have any questions regarding directions or hours of operation,  please call [phone].   As a reminder, please drink plenty of water prior to coming for your lab work. Thanks!  

## 2023-03-24 NOTE — Progress Notes (Signed)
 CBC and CMP WNL

## 2023-03-25 LAB — COMPLETE METABOLIC PANEL WITH GFR
AG Ratio: 1.6 (calc) (ref 1.0–2.5)
ALT: 22 U/L (ref 9–46)
AST: 18 U/L (ref 10–35)
Albumin: 4.1 g/dL (ref 3.6–5.1)
Alkaline phosphatase (APISO): 49 U/L (ref 35–144)
BUN: 25 mg/dL (ref 7–25)
CO2: 24 mmol/L (ref 20–32)
Calcium: 9 mg/dL (ref 8.6–10.3)
Chloride: 107 mmol/L (ref 98–110)
Creat: 1.26 mg/dL (ref 0.70–1.35)
Globulin: 2.6 g/dL (calc) (ref 1.9–3.7)
Glucose, Bld: 76 mg/dL (ref 65–99)
Potassium: 4.6 mmol/L (ref 3.5–5.3)
Sodium: 140 mmol/L (ref 135–146)
Total Bilirubin: 0.2 mg/dL (ref 0.2–1.2)
Total Protein: 6.7 g/dL (ref 6.1–8.1)
eGFR: 65 mL/min/{1.73_m2} (ref >=60)

## 2023-03-25 LAB — CBC WITH DIFFERENTIAL/PLATELET
Absolute Monocytes: 569 cells/uL (ref 200–950)
Basophils Absolute: 58 cells/uL (ref 0–200)
Basophils Relative: 0.8 %
Eosinophils Absolute: 281 cells/uL (ref 15–500)
Eosinophils Relative: 3.9 %
HCT: 43.6 % (ref 38.5–50.0)
Hemoglobin: 14.9 g/dL (ref 13.2–17.1)
Lymphs Abs: 2434 cells/uL (ref 850–3900)
MCH: 31.5 pg (ref 27.0–33.0)
MCHC: 34.2 g/dL (ref 32.0–36.0)
MCV: 92.2 fL (ref 80.0–100.0)
MPV: 9 fL (ref 7.5–12.5)
Monocytes Relative: 7.9 %
Neutro Abs: 3859 cells/uL (ref 1500–7800)
Neutrophils Relative %: 53.6 %
Platelets: 194 10*3/uL (ref 140–400)
RBC: 4.73 10*6/uL (ref 4.20–5.80)
RDW: 12.4 % (ref 11.0–15.0)
Total Lymphocyte: 33.8 %
WBC: 7.2 10*3/uL (ref 3.8–10.8)

## 2023-03-25 LAB — QUANTIFERON-TB GOLD PLUS
Mitogen-NIL: >10 IU/mL
NIL: 0.05 IU/mL
QuantiFERON-TB Gold Plus: NEGATIVE
TB1-NIL: <0 IU/mL
TB2-NIL: 0.02 IU/mL

## 2023-03-25 NOTE — Progress Notes (Signed)
 TB gold negative

## 2023-04-04 ENCOUNTER — Other Ambulatory Visit: Payer: Self-pay | Admitting: Physician Assistant

## 2023-04-04 DIAGNOSIS — L409 Psoriasis, unspecified: Secondary | ICD-10-CM

## 2023-04-04 DIAGNOSIS — Z79899 Other long term (current) drug therapy: Secondary | ICD-10-CM

## 2023-04-04 DIAGNOSIS — L4050 Arthropathic psoriasis, unspecified: Secondary | ICD-10-CM

## 2023-04-04 NOTE — Telephone Encounter (Signed)
Last Fill: 03/14/2023  Labs: 03/21/2023 CBC and CMP WNL   TB Gold: 03/21/2023 Neg    Next Visit: 08/22/2023  Last Visit: 03/21/2023  DX: Psoriatic arthritis   Current Dose per office note 03/21/2023: Humira 40 mg sq injections every 14 days.   Okay to refill Humira?

## 2023-05-01 ENCOUNTER — Other Ambulatory Visit (HOSPITAL_COMMUNITY): Payer: Self-pay

## 2023-05-01 MED ORDER — MELOXICAM 7.5 MG PO TABS
7.5000 mg | ORAL_TABLET | Freq: Every day | ORAL | 0 refills | Status: AC
  Filled 2023-05-01: qty 10, 10d supply, fill #0

## 2023-05-13 ENCOUNTER — Other Ambulatory Visit (HOSPITAL_COMMUNITY): Payer: Self-pay

## 2023-07-06 ENCOUNTER — Other Ambulatory Visit: Payer: Self-pay | Admitting: Physician Assistant

## 2023-07-06 DIAGNOSIS — L409 Psoriasis, unspecified: Secondary | ICD-10-CM

## 2023-07-06 DIAGNOSIS — L4050 Arthropathic psoriasis, unspecified: Secondary | ICD-10-CM

## 2023-07-06 DIAGNOSIS — Z79899 Other long term (current) drug therapy: Secondary | ICD-10-CM

## 2023-07-07 NOTE — Telephone Encounter (Signed)
Last Fill: 04/04/2023  Labs: 03/21/2023 CBC and CMP WNL   TB Gold: 03/21/2023 Neg    Next Visit: 08/22/2023  Last Visit: 03/21/2023  [id number] arthritis   Current Dose per office note 03/21/2023: Humira 40 mg sq injections every 14 days.   Patient advised he is due to update labs.   Okay to refill Humira?

## 2023-07-11 ENCOUNTER — Other Ambulatory Visit: Payer: Self-pay

## 2023-07-11 DIAGNOSIS — Z79899 Other long term (current) drug therapy: Secondary | ICD-10-CM

## 2023-07-12 LAB — CBC WITH DIFFERENTIAL/PLATELET
Absolute Lymphocytes: 1945 {cells}/uL (ref 850–3900)
Absolute Monocytes: 585 {cells}/uL (ref 200–950)
Basophils Absolute: 27 {cells}/uL (ref 0–200)
Basophils Relative: 0.4 %
Eosinophils Absolute: 224 {cells}/uL (ref 15–500)
Eosinophils Relative: 3.3 %
HCT: 45.6 % (ref 38.5–50.0)
Hemoglobin: 15.3 g/dL (ref 13.2–17.1)
MCH: 31.6 pg (ref 27.0–33.0)
MCHC: 33.6 g/dL (ref 32.0–36.0)
MCV: 94.2 fL (ref 80.0–100.0)
MPV: 8.9 fL (ref 7.5–12.5)
Monocytes Relative: 8.6 %
Neutro Abs: 4019 {cells}/uL (ref 1500–7800)
Neutrophils Relative %: 59.1 %
Platelets: 171 Thousand/uL (ref 140–400)
RBC: 4.84 Million/uL (ref 4.20–5.80)
RDW: 12.4 % (ref 11.0–15.0)
Total Lymphocyte: 28.6 %
WBC: 6.8 Thousand/uL (ref 3.8–10.8)

## 2023-07-12 LAB — COMPLETE METABOLIC PANEL WITHOUT GFR
AG Ratio: 1.6 (calc) (ref 1.0–2.5)
ALT: 22 U/L (ref 9–46)
AST: 20 U/L (ref 10–35)
Albumin: 4.1 g/dL (ref 3.6–5.1)
Alkaline phosphatase (APISO): 48 U/L (ref 35–144)
BUN: 21 mg/dL (ref 7–25)
CO2: 30 mmol/L (ref 20–32)
Calcium: 9 mg/dL (ref 8.6–10.3)
Chloride: 103 mmol/L (ref 98–110)
Creat: 1.27 mg/dL (ref 0.70–1.35)
Globulin: 2.6 g/dL (ref 1.9–3.7)
Glucose, Bld: 88 mg/dL (ref 65–99)
Potassium: 4.5 mmol/L (ref 3.5–5.3)
Sodium: 139 mmol/L (ref 135–146)
Total Bilirubin: 0.5 mg/dL (ref 0.2–1.2)
Total Protein: 6.7 g/dL (ref 6.1–8.1)
eGFR: 64 mL/min/1.73m2

## 2023-07-13 NOTE — Progress Notes (Signed)
CBC and CMP WNL

## 2023-07-23 ENCOUNTER — Telehealth: Payer: Self-pay | Admitting: Rheumatology

## 2023-07-23 NOTE — Telephone Encounter (Signed)
Pt is having surgery (knee replacement) on Wednesday 08/05/23. Pt would like to know if he should stop humira before then. Pts number is [phone] can leave a detailed message if no answer.

## 2023-07-23 NOTE — Telephone Encounter (Signed)
Attempted to contact the patient and left message to advise patient he will want to hold his medication for 2 weeks prior to the surgery and he may resume his medication 2 weeks after his surgery as long as he has been cleared by his surgeon. Patient advised he needs to be healing well with no infection. Patient advised to contact the office if he has any questions.

## 2023-08-03 ENCOUNTER — Other Ambulatory Visit: Payer: Self-pay | Admitting: Rheumatology

## 2023-08-03 DIAGNOSIS — L409 Psoriasis, unspecified: Secondary | ICD-10-CM

## 2023-08-03 DIAGNOSIS — L4050 Arthropathic psoriasis, unspecified: Secondary | ICD-10-CM

## 2023-08-03 DIAGNOSIS — Z79899 Other long term (current) drug therapy: Secondary | ICD-10-CM

## 2023-08-04 NOTE — Telephone Encounter (Signed)
Last Fill: 07/07/2023 (30 day supply)  Labs: 07/11/2023 CBC and CMP WNL   TB Gold: 03/21/2023  Neg   Next Visit: 08/22/2023  Last Visit: 03/21/2023  [id number] arthritis   Current Dose per office note 03/21/2023: Humira 40 mg sq injections every 14 days.   Okay to refill Humira?

## 2023-08-06 ENCOUNTER — Other Ambulatory Visit (HOSPITAL_COMMUNITY): Payer: Self-pay

## 2023-08-06 HISTORY — PX: REPLACEMENT TOTAL KNEE: SUR1224

## 2023-08-06 MED ORDER — OXYCODONE HCL 5 MG PO TABS
5.0000 mg | ORAL_TABLET | Freq: Four times a day (QID) | ORAL | 0 refills | Status: AC | PRN
  Filled 2023-08-06: qty 40, 5d supply, fill #0

## 2023-08-06 MED ORDER — DOCUSATE SODIUM 100 MG PO CAPS
100.0000 mg | ORAL_CAPSULE | Freq: Two times a day (BID) | ORAL | 0 refills | Status: AC
  Filled 2023-08-06: qty 30, 15d supply, fill #0

## 2023-08-06 MED ORDER — METHOCARBAMOL 500 MG PO TABS
500.0000 mg | ORAL_TABLET | Freq: Four times a day (QID) | ORAL | 0 refills | Status: DC
  Filled 2023-08-06: qty 60, 8d supply, fill #0

## 2023-08-06 MED ORDER — CELECOXIB 200 MG PO CAPS
200.0000 mg | ORAL_CAPSULE | Freq: Two times a day (BID) | ORAL | 0 refills | Status: DC
  Filled 2023-08-06: qty 60, 30d supply, fill #0

## 2023-08-06 MED ORDER — ONDANSETRON 4 MG PO TBDP
4.0000 mg | ORAL_TABLET | Freq: Three times a day (TID) | ORAL | 0 refills | Status: AC | PRN
  Filled 2023-08-06: qty 20, 7d supply, fill #0

## 2023-08-06 MED ORDER — ASPIRIN 81 MG PO TBEC
81.0000 mg | DELAYED_RELEASE_TABLET | Freq: Two times a day (BID) | ORAL | 0 refills | Status: AC
  Filled 2023-08-06: qty 60, 30d supply, fill #0

## 2023-08-08 NOTE — Progress Notes (Unsigned)
Office Visit Note  Patient: Bryan Sanders             Date of Birth: 1961/05/11           MRN: [id number]             PCP: Blair Heys, MD (Inactive) Referring: Blair Heys, MD Visit Date: 08/22/2023 Occupation: @GUAROCC @  Subjective:    History of Present Illness: Bryan Sanders is a 62 y.o. male with history of psoriatic arthritis and osteoarthritis.  Patient remains on  Humira 40 mg sq injections every 14 days. Initiated humira on 03/07/22.   CBC and CMP updated on 07/11/23--WNL.  His next lab work will be due in February and every 3 months.  TB gold negative on 03/21/23.  Discussed the importance of holding humira if he develops signs or symptoms of an infection and to resume once the infection has completely cleared.   Activities of Daily Living:  Patient reports morning stiffness for *** {minute/hour:19697}.   Patient {ACTIONS;DENIES/REPORTS:21021675::"Denies"} nocturnal pain.  Difficulty dressing/grooming: {ACTIONS;DENIES/REPORTS:21021675::"Denies"} Difficulty climbing stairs: {ACTIONS;DENIES/REPORTS:21021675::"Denies"} Difficulty getting out of chair: {ACTIONS;DENIES/REPORTS:21021675::"Denies"} Difficulty using hands for taps, buttons, cutlery, and/or writing: {ACTIONS;DENIES/REPORTS:21021675::"Denies"}  No Rheumatology ROS completed.   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   Osteoarthritis of knee 03/07/2022   Psoriasis 03/07/2022   Psoriatic arthritis (HCC) 03/07/2022   Pure hypercholesterolemia 03/07/2022   Obesity 07/21/2008   Obstructive sleep apnea 07/21/2008    Past Medical History:  Diagnosis Date   Ankylosis of lumbar spine    Cellulitis    Hypertension    IBS (irritable bowel syndrome)    Pre-diabetes     Psoriasis    Sleep apnea    Spondylosis     Family History  Problem Relation Age of Onset   Diabetes Mother    Heart disease Mother    Diabetes Brother    Past Surgical History:  Procedure Laterality Date   APPENDECTOMY  09/09/1993   VASECTOMY     Social History   Social History Narrative   Not on file    There is no immunization history on file for this patient.   Objective: Vital Signs: There were no vitals taken for this visit.   Physical Exam Vitals and nursing note reviewed.  Constitutional:      Appearance: He is well-developed.  HENT:     Head: Normocephalic and atraumatic.  Eyes:     Conjunctiva/sclera: Conjunctivae normal.     Pupils: Pupils are equal, round, and reactive to light.  Cardiovascular:     Rate and Rhythm: Normal rate and regular rhythm.     Heart sounds: Normal heart sounds.  Pulmonary:     Effort: Pulmonary effort is normal.     Breath sounds: Normal breath sounds.  Abdominal:     General: Bowel sounds are normal.     Palpations: Abdomen is soft.  Musculoskeletal:     Cervical back: Normal range of motion and neck supple.  Skin:    General: Skin is warm and dry.     Capillary Refill: Capillary refill takes less than 2 seconds.  Neurological:     Mental Status: He is alert and oriented to person, place, and time.  Psychiatric:        Behavior: Behavior  normal.      Musculoskeletal Exam: ***  CDAI Exam: CDAI Score: -- Patient Global: --; Provider Global: -- Swollen: --; Tender: -- Joint Exam 08/22/2023   No joint exam has been documented for this visit   There is currently no information documented on the homunculus. Go to the Rheumatology activity and complete the homunculus joint exam.  Investigation: No additional findings.  Imaging: No results found.  Recent Labs: Lab Results  Component Value Date   WBC 6.8 07/11/2023   HGB 15.3 07/11/2023   PLT 171 07/11/2023   NA 139 07/11/2023   K 4.5 07/11/2023   CL 103  07/11/2023   CO2 30 07/11/2023   GLUCOSE 88 07/11/2023   BUN 21 07/11/2023   CREATININE 1.27 07/11/2023   BILITOT 0.5 07/11/2023   AST 20 07/11/2023   ALT 22 07/11/2023   PROT 6.7 07/11/2023   CALCIUM 9.0 07/11/2023   QFTBGOLDPLUS NEGATIVE 03/21/2023    Speciality Comments: No specialty comments available.  Procedures:  No procedures performed Allergies: Patient has no known allergies.   Assessment / Plan:     Visit Diagnoses: Psoriatic arthritis (HCC)-HLA-B27 negative  Psoriasis  High risk medication use  Primary osteoarthritis of left knee  Pain in thoracic spine  Bilateral sacroiliitis (HCC)  Cellulitis of right lower extremity  History of diabetes mellitus  Essential hypertension  History of gastroesophageal reflux (GERD)  History of IBS  Obstructive sleep apnea  Family history of psoriasis- daughter  Elevated serum creatinine  History of cellulitis  Orders: No orders of the defined types were placed in this encounter.  No orders of the defined types were placed in this encounter.   Face-to-face time spent with patient was *** minutes. Greater than 50% of time was spent in counseling and coordination of care.  Follow-Up Instructions: No follow-ups on file.   Gearldine Bienenstock, PA-C  Note - This record has been created using Dragon software.  Chart creation errors have been sought, but may not always  have been located. Such creation errors do not reflect on  the standard of medical care.

## 2023-08-11 ENCOUNTER — Other Ambulatory Visit (HOSPITAL_COMMUNITY): Payer: Self-pay

## 2023-08-11 MED ORDER — OXYCODONE HCL 5 MG PO TABS
5.0000 mg | ORAL_TABLET | Freq: Four times a day (QID) | ORAL | 0 refills | Status: AC | PRN
  Filled 2023-08-11: qty 40, 7d supply, fill #0

## 2023-08-14 ENCOUNTER — Other Ambulatory Visit (HOSPITAL_COMMUNITY): Payer: Self-pay

## 2023-08-14 MED ORDER — METHOCARBAMOL 500 MG PO TABS
500.0000 mg | ORAL_TABLET | Freq: Four times a day (QID) | ORAL | 0 refills | Status: AC
  Filled 2023-08-14: qty 60, 8d supply, fill #0

## 2023-08-15 ENCOUNTER — Other Ambulatory Visit: Payer: Self-pay

## 2023-08-15 ENCOUNTER — Other Ambulatory Visit (HOSPITAL_COMMUNITY): Payer: Self-pay

## 2023-08-15 MED ORDER — OXYCODONE HCL 5 MG PO TABS
5.0000 mg | ORAL_TABLET | Freq: Four times a day (QID) | ORAL | 0 refills | Status: AC | PRN
  Filled 2023-08-15: qty 40, 5d supply, fill #0

## 2023-08-22 ENCOUNTER — Ambulatory Visit: Payer: 59 | Admitting: Physician Assistant

## 2023-08-22 DIAGNOSIS — L409 Psoriasis, unspecified: Secondary | ICD-10-CM

## 2023-08-22 DIAGNOSIS — L4050 Arthropathic psoriasis, unspecified: Secondary | ICD-10-CM

## 2023-08-22 DIAGNOSIS — L03115 Cellulitis of right lower limb: Secondary | ICD-10-CM

## 2023-08-22 DIAGNOSIS — M546 Pain in thoracic spine: Secondary | ICD-10-CM

## 2023-08-22 DIAGNOSIS — Z840 Family history of diseases of the skin and subcutaneous tissue: Secondary | ICD-10-CM

## 2023-08-22 DIAGNOSIS — Z8639 Personal history of other endocrine, nutritional and metabolic disease: Secondary | ICD-10-CM

## 2023-08-22 DIAGNOSIS — M461 Sacroiliitis, not elsewhere classified: Secondary | ICD-10-CM

## 2023-08-22 DIAGNOSIS — R7989 Other specified abnormal findings of blood chemistry: Secondary | ICD-10-CM

## 2023-08-22 DIAGNOSIS — I10 Essential (primary) hypertension: Secondary | ICD-10-CM

## 2023-08-22 DIAGNOSIS — Z872 Personal history of diseases of the skin and subcutaneous tissue: Secondary | ICD-10-CM

## 2023-08-22 DIAGNOSIS — Z8719 Personal history of other diseases of the digestive system: Secondary | ICD-10-CM

## 2023-08-22 DIAGNOSIS — Z79899 Other long term (current) drug therapy: Secondary | ICD-10-CM

## 2023-08-22 DIAGNOSIS — M1712 Unilateral primary osteoarthritis, left knee: Secondary | ICD-10-CM

## 2023-08-22 DIAGNOSIS — G4733 Obstructive sleep apnea (adult) (pediatric): Secondary | ICD-10-CM

## 2023-08-29 ENCOUNTER — Other Ambulatory Visit (HOSPITAL_COMMUNITY): Payer: Self-pay

## 2023-08-29 MED ORDER — HYDROXYZINE HCL 50 MG PO TABS
50.0000 mg | ORAL_TABLET | Freq: Every evening | ORAL | 0 refills | Status: AC
  Filled 2023-08-29: qty 30, 15d supply, fill #0

## 2023-11-22 ENCOUNTER — Other Ambulatory Visit: Payer: Self-pay | Admitting: Physician Assistant

## 2023-11-22 DIAGNOSIS — Z79899 Other long term (current) drug therapy: Secondary | ICD-10-CM

## 2023-11-22 DIAGNOSIS — L409 Psoriasis, unspecified: Secondary | ICD-10-CM

## 2023-11-22 DIAGNOSIS — L4050 Arthropathic psoriasis, unspecified: Secondary | ICD-10-CM

## 2023-12-01 ENCOUNTER — Other Ambulatory Visit: Payer: Self-pay | Admitting: Physician Assistant

## 2023-12-01 DIAGNOSIS — L4050 Arthropathic psoriasis, unspecified: Secondary | ICD-10-CM

## 2023-12-01 DIAGNOSIS — L409 Psoriasis, unspecified: Secondary | ICD-10-CM

## 2023-12-01 DIAGNOSIS — Z79899 Other long term (current) drug therapy: Secondary | ICD-10-CM

## 2023-12-01 NOTE — Progress Notes (Unsigned)
 Office Visit Note  Patient: Bryan Sanders             Date of Birth: Aug 18, 1961           MRN: [id number]             PCP: Blair Heys, MD (Inactive) Referring: No ref. provider found Visit Date: 12/05/2023 Occupation: @GUAROCC @  Subjective:  No chief complaint on file.   History of Present Illness: Bryan Sanders is a 63 y.o. male ***     Activities of Daily Living:  Patient reports morning stiffness for *** {minute/hour:19697}.   Patient {ACTIONS;DENIES/REPORTS:21021675::"Denies"} nocturnal pain.  Difficulty dressing/grooming: {ACTIONS;DENIES/REPORTS:21021675::"Denies"} Difficulty climbing stairs: {ACTIONS;DENIES/REPORTS:21021675::"Denies"} Difficulty getting out of chair: {ACTIONS;DENIES/REPORTS:21021675::"Denies"} Difficulty using hands for taps, buttons, cutlery, and/or writing: {ACTIONS;DENIES/REPORTS:21021675::"Denies"}  No Rheumatology ROS completed.   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   Osteoarthritis of knee 03/07/2022   Psoriasis 03/07/2022   Psoriatic arthritis (HCC) 03/07/2022   Pure hypercholesterolemia 03/07/2022   Obesity 07/21/2008   Obstructive sleep apnea 07/21/2008    Past Medical History:  Diagnosis Date   Ankylosis of lumbar spine    Cellulitis    Hypertension    IBS (irritable bowel syndrome)    Pre-diabetes    Psoriasis    Sleep apnea    Spondylosis     Family History  Problem Relation Age of Onset   Diabetes Mother    Heart disease Mother    Diabetes Brother    Past Surgical History:  Procedure Laterality Date   APPENDECTOMY  09/09/1993   VASECTOMY     Social History   Social History Narrative   Not on file    There is no immunization history on file for this patient.    Objective: Vital Signs: There were no vitals taken for this visit.   Physical Exam   Musculoskeletal Exam: ***  CDAI Exam: CDAI Score: -- Patient Global: --; Provider Global: -- Swollen: --; Tender: -- Joint Exam 12/05/2023   No joint exam has been documented for this visit   There is currently no information documented on the homunculus. Go to the Rheumatology activity and complete the homunculus joint exam.  Investigation: No additional findings.  Imaging: No results found.  Recent Labs: Lab Results  Component Value Date   WBC 6.8 07/11/2023   HGB 15.3 07/11/2023   PLT 171 07/11/2023   NA 139 07/11/2023   K 4.5 07/11/2023   CL 103 07/11/2023   CO2 30 07/11/2023   GLUCOSE 88 07/11/2023   BUN 21 07/11/2023   CREATININE 1.27 07/11/2023   BILITOT 0.5 07/11/2023   AST 20 07/11/2023   ALT 22 07/11/2023   PROT 6.7 07/11/2023   CALCIUM 9.0 07/11/2023   QFTBGOLDPLUS NEGATIVE 03/21/2023    Speciality Comments: No specialty comments available.  Procedures:  No procedures performed Allergies: Patient has no known allergies.   Assessment / Plan:     Visit Diagnoses: Psoriatic arthritis (HCC)-HLA-B27 negative  Psoriasis  High risk medication use  Primary osteoarthritis of left knee  Pain in thoracic spine  Bilateral sacroiliitis (HCC)  Cellulitis of right lower extremity  History of diabetes mellitus  Essential hypertension  History of gastroesophageal reflux (GERD)  History of IBS  Obstructive sleep apnea  Family history of  psoriasis- daughter  History of cellulitis  Orders: No orders of the defined types were placed in this encounter.  No orders of the defined types were placed in this encounter.   Face-to-face time spent with patient was *** minutes. Greater than 50% of time was spent in counseling and coordination of care.  Follow-Up Instructions: No follow-ups on file.   Gearldine Bienenstock, PA-C  Note - This record has been created  using Dragon software.  Chart creation errors have been sought, but may not always  have been located. Such creation errors do not reflect on  the standard of medical care.

## 2023-12-05 ENCOUNTER — Encounter: Payer: Self-pay | Admitting: Physician Assistant

## 2023-12-05 ENCOUNTER — Ambulatory Visit: Attending: Physician Assistant | Admitting: Physician Assistant

## 2023-12-05 VITALS — BP 121/72 | HR 49 | Resp 16 | Ht 70.0 in | Wt 299.4 lb

## 2023-12-05 DIAGNOSIS — M461 Sacroiliitis, not elsewhere classified: Secondary | ICD-10-CM

## 2023-12-05 DIAGNOSIS — L4050 Arthropathic psoriasis, unspecified: Secondary | ICD-10-CM

## 2023-12-05 DIAGNOSIS — L409 Psoriasis, unspecified: Secondary | ICD-10-CM | POA: Diagnosis not present

## 2023-12-05 DIAGNOSIS — Z840 Family history of diseases of the skin and subcutaneous tissue: Secondary | ICD-10-CM

## 2023-12-05 DIAGNOSIS — M546 Pain in thoracic spine: Secondary | ICD-10-CM

## 2023-12-05 DIAGNOSIS — I10 Essential (primary) hypertension: Secondary | ICD-10-CM

## 2023-12-05 DIAGNOSIS — Z872 Personal history of diseases of the skin and subcutaneous tissue: Secondary | ICD-10-CM

## 2023-12-05 DIAGNOSIS — Z8639 Personal history of other endocrine, nutritional and metabolic disease: Secondary | ICD-10-CM

## 2023-12-05 DIAGNOSIS — Z96652 Presence of left artificial knee joint: Secondary | ICD-10-CM

## 2023-12-05 DIAGNOSIS — Z8719 Personal history of other diseases of the digestive system: Secondary | ICD-10-CM

## 2023-12-05 DIAGNOSIS — Z79899 Other long term (current) drug therapy: Secondary | ICD-10-CM | POA: Diagnosis not present

## 2023-12-05 DIAGNOSIS — G4733 Obstructive sleep apnea (adult) (pediatric): Secondary | ICD-10-CM

## 2023-12-05 DIAGNOSIS — L03115 Cellulitis of right lower limb: Secondary | ICD-10-CM

## 2023-12-05 DIAGNOSIS — M1712 Unilateral primary osteoarthritis, left knee: Secondary | ICD-10-CM

## 2023-12-05 LAB — CBC WITH DIFFERENTIAL/PLATELET
Absolute Lymphocytes: 1860 {cells}/uL (ref 850–3900)
Absolute Monocytes: 496 {cells}/uL (ref 200–950)
Basophils Absolute: 19 {cells}/uL (ref 0–200)
Basophils Relative: 0.3 %
Eosinophils Absolute: 198 {cells}/uL (ref 15–500)
Eosinophils Relative: 3.2 %
HCT: 44.6 % (ref 38.5–50.0)
Hemoglobin: 15.2 g/dL (ref 13.2–17.1)
MCH: 30.5 pg (ref 27.0–33.0)
MCHC: 34.1 g/dL (ref 32.0–36.0)
MCV: 89.4 fL (ref 80.0–100.0)
MPV: 9.1 fL (ref 7.5–12.5)
Monocytes Relative: 8 %
Neutro Abs: 3627 {cells}/uL (ref 1500–7800)
Neutrophils Relative %: 58.5 %
Platelets: 177 10*3/uL (ref 140–400)
RBC: 4.99 10*6/uL (ref 4.20–5.80)
RDW: 13.2 % (ref 11.0–15.0)
Total Lymphocyte: 30 %
WBC: 6.2 10*3/uL (ref 3.8–10.8)

## 2023-12-05 LAB — COMPREHENSIVE METABOLIC PANEL WITH GFR
AG Ratio: 1.6 (calc) (ref 1.0–2.5)
ALT: 18 U/L (ref 9–46)
AST: 16 U/L (ref 10–35)
Albumin: 4.2 g/dL (ref 3.6–5.1)
Alkaline phosphatase (APISO): 64 U/L (ref 35–144)
BUN: 21 mg/dL (ref 7–25)
CO2: 30 mmol/L (ref 20–32)
Calcium: 9.1 mg/dL (ref 8.6–10.3)
Chloride: 104 mmol/L (ref 98–110)
Creat: 1.11 mg/dL (ref 0.70–1.35)
Globulin: 2.7 g/dL (ref 1.9–3.7)
Glucose, Bld: 96 mg/dL (ref 65–99)
Potassium: 4.7 mmol/L (ref 3.5–5.3)
Sodium: 138 mmol/L (ref 135–146)
Total Bilirubin: 0.4 mg/dL (ref 0.2–1.2)
Total Protein: 6.9 g/dL (ref 6.1–8.1)
eGFR: 76 mL/min/{1.73_m2} (ref >=60)

## 2023-12-05 MED ORDER — HUMIRA (2 PEN) 40 MG/0.4ML ~~LOC~~ AJKT: AUTO-INJECTOR | SUBCUTANEOUS | 2 refills | Status: DC

## 2023-12-05 NOTE — Patient Instructions (Signed)
 Standing Labs We placed an order today for your standing lab work.   Please have your standing labs drawn end of June and every 3 months   Please have your labs drawn 2 weeks prior to your appointment so that the provider can discuss your lab results at your appointment, if possible.  Please note that you may see your imaging and lab results in MyChart before we have reviewed them. We will contact you once all results are reviewed. Please allow our office up to 72 hours to thoroughly review all of the results before contacting the office for clarification of your results.  WALK-IN LAB HOURS  Monday through Thursday from 8:00 am -12:30 pm and 1:00 pm-5:00 pm and Friday from 8:00 am-12:00 pm.  Patients with office visits requiring labs will be seen before walk-in labs.  You may encounter longer than normal wait times. Please allow additional time. Wait times may be shorter on  Monday and Thursday afternoons.  We do not book appointments for walk-in labs. We appreciate your patience and understanding with our staff.   Labs are drawn by [facility]. Please bring your co-pay at the time of your lab draw.  You may receive a bill from [facility] for your lab work.  Please note if you are on Hydroxychloroquine and and an order has been placed for a Hydroxychloroquine level,  you will need to have it drawn 4 hours or more after your last dose.  If you wish to have your labs drawn at another location, please call the office 24 hours in advance so we can fax the orders.  The office is located at [address], [place], [place] [address]   If you have any questions regarding directions or hours of operation,  please call [phone].   As a reminder, please drink plenty of water prior to coming for your lab work. Thanks!

## 2023-12-07 NOTE — Progress Notes (Signed)
 CBC and CMP WNL

## 2024-01-26 ENCOUNTER — Telehealth: Payer: Self-pay

## 2024-01-26 NOTE — Telephone Encounter (Signed)
 Submitted a Prior Authorization RENEWAL request to [employer] for HUMIRA  via CoverMyMeds. Will update once we receive a response.  Key: [id number]

## 2024-01-26 NOTE — Telephone Encounter (Signed)
 Received notification from [employer] regarding a prior authorization for HUMIRA . Authorization has been APPROVED from 01/26/24 to 01/25/25. Approval letter sent to scan center.  Authorization # [id number]  Geraldene Kleine, PharmD, MPH, BCPS, CPP Clinical Pharmacist (Rheumatology and Pulmonology)

## 2024-02-12 ENCOUNTER — Other Ambulatory Visit: Payer: Self-pay | Admitting: Physician Assistant

## 2024-02-12 NOTE — Telephone Encounter (Signed)
 Last Fill: 12/05/2023  Labs: 12/05/2023 CBC and CMP WNL   TB Gold: 03/21/2023 Neg    Next Visit: Due August 2025. Message sent to the front to schedule.   Last Visit: 12/05/2023  DX: Psoriatic arthritis   Current Dose per office note 12/05/2023: Humira  40 mg sq injections every 14 days   Okay to refill Humira ?

## 2024-02-12 NOTE — Telephone Encounter (Signed)
 Please schedule patient a follow up visit. Patient due August 2025. Thanks!   Follow-Up Instructions: Return in about 5 months (around 05/06/2024) for Psoriatic arthritis, Osteoarthritis.

## 2024-04-30 NOTE — Progress Notes (Unsigned)
 Office Visit Note  Patient: Bryan Sanders             Date of Birth: 09-20-1960           MRN: [id number]             PCP: Hugh Charleston, MD (Inactive) Referring: No ref. provider found Visit Date: 05/14/2024 Occupation: @GUAROCC @  Subjective:  Medication monitoring  History of Present Illness: Bryan Sanders is a 63 y.o. male with history of psoriatic arthritis.  Patient is on Humira  40 mg subcu days injections once every 14 days.  He is tolerating Humira  without any side effects and has not had any injection site reactions.  He has not had any recent gaps in therapy.  Patient states that his mobility has improved since having the left knee replaced in November 2024 by Dr. Rubie.  He is only been experiencing morning stiffness for a few minutes daily.  He has not had any nocturnal pain or difficulty performing ADLs.  He has occasional discomfort in his right hand which he attributes to repetitive and overuse activities at work.  He has been using Voltaren gel topically as needed for pain relief which has been helpful.  He denies any new medical conditions.  He denies any recent or recurrent infections. Patient had an annual dermatology visit for skin cancer screening which was unremarkable per patient.  He plans to continue to follow-up with dermatology yearly as recommended.   Activities of Daily Living:  Patient reports morning stiffness for a few minutes.   Patient Denies nocturnal pain.  Difficulty dressing/grooming: Denies Difficulty climbing stairs: Denies Difficulty getting out of chair: Denies Difficulty using hands for taps, buttons, cutlery, and/or writing: Denies  Review of Systems  Constitutional:  Negative for fatigue.  HENT:  Negative for mouth sores and mouth dryness.   Eyes:  Positive for dryness.  Respiratory:  Negative for shortness of breath and wheezing.   Cardiovascular:  Negative for chest pain and palpitations.  Gastrointestinal:  Negative for blood in stool,  constipation and diarrhea.  Endocrine: Negative for increased urination.  Genitourinary:  Negative for involuntary urination.  Musculoskeletal:  Positive for joint pain, joint pain and morning stiffness. Negative for gait problem, joint swelling, myalgias, muscle weakness, muscle tenderness and myalgias.  Skin:  Negative for color change, rash and sensitivity to sunlight.  Allergic/Immunologic: Negative for susceptible to infections.  Neurological:  Negative for dizziness, numbness and headaches.  Hematological:  Negative for swollen glands.  Psychiatric/Behavioral:  Negative for depressed mood and sleep disturbance. The patient is not nervous/anxious.     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   Osteoarthritis of knee 03/07/2022   Psoriasis 03/07/2022   Psoriatic arthritis (HCC) 03/07/2022   Pure hypercholesterolemia 03/07/2022   Obesity 07/21/2008   Obstructive sleep apnea 07/21/2008    Past Medical History:  Diagnosis Date   Ankylosis of lumbar spine    Cellulitis    Hypertension    IBS (irritable bowel syndrome)    Pre-diabetes    Psoriasis    Sleep apnea    Spondylosis     Family History  Problem Relation Age of Onset   Diabetes Mother    Heart disease Mother    Diabetes Brother    Past  Surgical History:  Procedure Laterality Date   APPENDECTOMY  09/09/1993   REPLACEMENT TOTAL KNEE Left 08/06/2023   VASECTOMY     Social History   Social History Narrative   Not on file    There is no immunization history on file for this patient.   Objective: Vital Signs: BP 126/69 (BP Location: Right Arm, Patient Position: Sitting, Cuff Size: Large)   Pulse (!) 48   Resp 17   Ht 5' 11 (1.803 m)   Wt 297 lb 3.2 oz (134.8 kg)   BMI  41.45 kg/m    Physical Exam Vitals and nursing note reviewed.  Constitutional:      Appearance: He is well-developed.  HENT:     Head: Normocephalic and atraumatic.  Eyes:     Conjunctiva/sclera: Conjunctivae normal.     Pupils: Pupils are equal, round, and reactive to light.  Cardiovascular:     Rate and Rhythm: Normal rate and regular rhythm.     Heart sounds: Normal heart sounds.  Pulmonary:     Effort: Pulmonary effort is normal.     Breath sounds: Normal breath sounds.  Abdominal:     General: Bowel sounds are normal.     Palpations: Abdomen is soft.  Musculoskeletal:     Cervical back: Normal range of motion and neck supple.  Skin:    General: Skin is warm and dry.     Capillary Refill: Capillary refill takes less than 2 seconds.  Neurological:     Mental Status: He is alert and oriented to person, place, and time.  Psychiatric:        Behavior: Behavior normal.      Musculoskeletal Exam: C-spine has limited range of motion.  Thoracic kyphosis noted.  No midline spinal tenderness.  No SI joint tenderness.  Shoulder joints, elbow joints, strength, MCPs and PIPs, DIPs have good range of motion no synovitis.  PIP and DIP thickening consistent with osteoarthritis of both hands. Hip joints have good ROM with no groin pain.  Left knee replacement has good ROM with no warmth or effusion.  Pedal edema noted in left LE.  Right knee has good ROM with no warmth or effusion. Ankle joints have good ROM with no tenderness or joint swelling.   CDAI Exam: CDAI Score: -- Patient Global: --; Provider Global: -- Swollen: --; Tender: -- Joint Exam 05/14/2024   No joint exam has been documented for this visit   There is currently no information documented on the homunculus. Go to the Rheumatology activity and complete the homunculus joint exam.  Investigation: No additional findings.  Imaging: No results found.  Recent Labs: Lab Results  Component Value Date   WBC 6.2  12/05/2023   HGB 15.2 12/05/2023   PLT 177 12/05/2023   NA 138 12/05/2023   K 4.7 12/05/2023   CL 104 12/05/2023   CO2 30 12/05/2023   GLUCOSE 96 12/05/2023   BUN 21 12/05/2023   CREATININE 1.11 12/05/2023   BILITOT 0.4 12/05/2023   AST 16 12/05/2023   ALT 18 12/05/2023   PROT 6.9 12/05/2023   CALCIUM 9.1 12/05/2023   QFTBGOLDPLUS NEGATIVE 03/21/2023    Speciality Comments: No specialty comments available.  Procedures:  No procedures performed Allergies: Patient has no known allergies.   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  SI joint sclerosis. History of thoracic kyphosis for many years:  He has no synovitis or dactylitis on examination today.  He has not had any signs or symptoms of a psoriatic arthritis flare.  He denies any active psoriasis at this time.  He denies any evidence of Achilles tendinitis or plantar fasciitis.  He has no SI joint tenderness upon palpation.  Overall he is clinically been doing well Humira  40 mg sq injections once every 14 days.  He is tolerating Humira  without any side effects and has not had any recent gaps in therapy.  No medication changes will be made at this time.  He was advised to notify us  if he develops signs or symptoms of a flare.  He will follow-up in the office in 5 months or sooner if needed.  Psoriasis - Dx by dermatology-10 years ago.  Palmar pustular psoriasis-resolved. Psoriasis in his ear canals has resolved.  No active psoriasis at this time.   High risk medication use - Humira  40 mg sq injections every 14 days. Initiated humira  on 03/07/22.  CBC and CMP were within normal limits on 12/05/2023.  CBC and CMP updated today.  TB gold negative on 03/21/2023.  Order for TB gold released today. No recent or recurrent infections.  Discussed the importance of holding Humira  if he develops signs or symptoms of an infection  and to resume once the infection is completely cleared.- Plan: CBC with Differential/Platelet, Comprehensive metabolic panel with GFR, QuantiFERON-TB Gold Plus  Screening for tuberculosis - Order for TB gold released today. Plan: QuantiFERON-TB Gold Plus  S/P total knee arthroplasty, left - Performed by Dr. Rubie on 08/06/2023.  Doing well.  His mobility has improved.  No warmth or effusion noted.  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  Thoracic kyphosis noted.  No midline spinal tenderness.  Bilateral sacroiliitis (HCC) - History of chronic gluteal pain.  X-rays showed bilateral sacroiliitis.  No SI joint tenderness upon palpation.  Other medical conditions are listed as follows:  Cellulitis of right lower extremity - No recurrence.  History of diabetes mellitus  History of gastroesophageal reflux (GERD)  Essential hypertension: Blood pressure was 126/69 today in the office.  Obstructive sleep apnea  History of IBS  History of cellulitis  Family history of psoriasis- daughter    Orders: Orders Placed This Encounter  Procedures   CBC with Differential/Platelet   Comprehensive metabolic panel with GFR   QuantiFERON-TB Gold Plus   No orders of the defined types were placed in this encounter.    Follow-Up Instructions: Return in about 5 months (around 10/14/2024) for Psoriatic arthritis.   Waddell CHRISTELLA Craze, PA-C  Note - This record has been created using Dragon software.  Chart creation errors have been sought, but may not always  have been located. Such creation errors do not reflect on  the standard of medical care.

## 2024-05-07 ENCOUNTER — Other Ambulatory Visit: Payer: Self-pay | Admitting: Physician Assistant

## 2024-05-07 NOTE — Telephone Encounter (Signed)
 Last Fill: 02/12/2024  Labs: 12/05/2023 CBC and CMP WNL   TB Gold: 03/21/2023 TB Gold Negative  Next Visit: 05/14/2024  Last Visit: 12/05/2023  DX: Psoriatic arthritis   Current Dose per office note 12/05/2023: Humira  40 mg sq injections every 14 days   Okay to refill Humira ?   Attempted to contact patient to update labs, voicemail not set up could not leave message.

## 2024-05-14 ENCOUNTER — Encounter: Payer: Self-pay | Admitting: Physician Assistant

## 2024-05-14 ENCOUNTER — Ambulatory Visit: Attending: Physician Assistant | Admitting: Physician Assistant

## 2024-05-14 VITALS — BP 126/69 | HR 48 | Resp 17 | Ht 71.0 in | Wt 297.2 lb

## 2024-05-14 DIAGNOSIS — Z8719 Personal history of other diseases of the digestive system: Secondary | ICD-10-CM

## 2024-05-14 DIAGNOSIS — M546 Pain in thoracic spine: Secondary | ICD-10-CM

## 2024-05-14 DIAGNOSIS — Z872 Personal history of diseases of the skin and subcutaneous tissue: Secondary | ICD-10-CM

## 2024-05-14 DIAGNOSIS — Z96652 Presence of left artificial knee joint: Secondary | ICD-10-CM

## 2024-05-14 DIAGNOSIS — L409 Psoriasis, unspecified: Secondary | ICD-10-CM

## 2024-05-14 DIAGNOSIS — G4733 Obstructive sleep apnea (adult) (pediatric): Secondary | ICD-10-CM

## 2024-05-14 DIAGNOSIS — Z840 Family history of diseases of the skin and subcutaneous tissue: Secondary | ICD-10-CM

## 2024-05-14 DIAGNOSIS — L03115 Cellulitis of right lower limb: Secondary | ICD-10-CM

## 2024-05-14 DIAGNOSIS — I10 Essential (primary) hypertension: Secondary | ICD-10-CM

## 2024-05-14 DIAGNOSIS — Z79899 Other long term (current) drug therapy: Secondary | ICD-10-CM | POA: Diagnosis not present

## 2024-05-14 DIAGNOSIS — L4050 Arthropathic psoriasis, unspecified: Secondary | ICD-10-CM | POA: Diagnosis not present

## 2024-05-14 DIAGNOSIS — M461 Sacroiliitis, not elsewhere classified: Secondary | ICD-10-CM

## 2024-05-14 DIAGNOSIS — Z8639 Personal history of other endocrine, nutritional and metabolic disease: Secondary | ICD-10-CM

## 2024-05-14 DIAGNOSIS — Z111 Encounter for screening for respiratory tuberculosis: Secondary | ICD-10-CM

## 2024-05-14 NOTE — Patient Instructions (Signed)

## 2024-05-16 ENCOUNTER — Ambulatory Visit: Payer: Self-pay | Admitting: Physician Assistant

## 2024-05-16 NOTE — Progress Notes (Signed)
 CBC and CMP WNL

## 2024-05-18 LAB — CBC WITH DIFFERENTIAL/PLATELET
Absolute Lymphocytes: 1903 {cells}/uL (ref 850–3900)
Absolute Monocytes: 512 {cells}/uL (ref 200–950)
Basophils Absolute: 31 {cells}/uL (ref 0–200)
Basophils Relative: 0.5 %
Eosinophils Absolute: 366 {cells}/uL (ref 15–500)
Eosinophils Relative: 6 %
HCT: 43.5 % (ref 38.5–50.0)
Hemoglobin: 14.8 g/dL (ref 13.2–17.1)
MCH: 31.9 pg (ref 27.0–33.0)
MCHC: 34 g/dL (ref 32.0–36.0)
MCV: 93.8 fL (ref 80.0–100.0)
MPV: 8.6 fL (ref 7.5–12.5)
Monocytes Relative: 8.4 %
Neutro Abs: 3288 {cells}/uL (ref 1500–7800)
Neutrophils Relative %: 53.9 %
Platelets: 157 Thousand/uL (ref 140–400)
RBC: 4.64 Million/uL (ref 4.20–5.80)
RDW: 12.8 % (ref 11.0–15.0)
Total Lymphocyte: 31.2 %
WBC: 6.1 Thousand/uL (ref 3.8–10.8)

## 2024-05-18 LAB — QUANTIFERON-TB GOLD PLUS
Mitogen-NIL: 9.44 [IU]/mL
NIL: 0.02 [IU]/mL
QuantiFERON-TB Gold Plus: UNDETERMINED — AB
TB1-NIL: 0 [IU]/mL
TB2-NIL: 0 [IU]/mL

## 2024-05-18 LAB — COMPREHENSIVE METABOLIC PANEL WITH GFR
AG Ratio: 1.7 (calc) (ref 1.0–2.5)
ALT: 20 U/L (ref 9–46)
AST: 16 U/L (ref 10–35)
Albumin: 4.3 g/dL (ref 3.6–5.1)
Alkaline phosphatase (APISO): 52 U/L (ref 35–144)
BUN: 20 mg/dL (ref 7–25)
CO2: 29 mmol/L (ref 20–32)
Calcium: 8.9 mg/dL (ref 8.6–10.3)
Chloride: 104 mmol/L (ref 98–110)
Creat: 1.14 mg/dL (ref 0.70–1.35)
Globulin: 2.5 g/dL (ref 1.9–3.7)
Glucose, Bld: 82 mg/dL (ref 65–99)
Potassium: 4.6 mmol/L (ref 3.5–5.3)
Sodium: 140 mmol/L (ref 135–146)
Total Bilirubin: 0.6 mg/dL (ref 0.2–1.2)
Total Protein: 6.8 g/dL (ref 6.1–8.1)
eGFR: 73 mL/min/1.73m2 (ref >=60)

## 2024-05-18 NOTE — Progress Notes (Signed)
 TB gold negative

## 2024-06-02 ENCOUNTER — Other Ambulatory Visit (HOSPITAL_COMMUNITY): Payer: Self-pay

## 2024-06-02 MED ORDER — AMOXICILLIN 500 MG PO CAPS
2000.0000 mg | ORAL_CAPSULE | ORAL | 0 refills | Status: AC
  Filled 2024-06-02: qty 16, 4d supply, fill #0

## 2024-06-03 ENCOUNTER — Other Ambulatory Visit (HOSPITAL_COMMUNITY): Payer: Self-pay

## 2024-06-04 ENCOUNTER — Other Ambulatory Visit: Payer: Self-pay | Admitting: Physician Assistant

## 2024-06-04 NOTE — Telephone Encounter (Signed)
 Last Fill: 05/07/2024 (30 day supply)  Labs: 05/14/2024 CBC and CMP WNL   TB Gold: 05/14/2024 Neg    Next Visit: 11/05/2024  Last Visit: 05/14/2024  IK:Ednmpjupr arthritis   Current Dose per office note 05/14/2024: Humira  40 mg sq injections every 14 days   Okay to refill Humira ?

## 2024-08-27 ENCOUNTER — Other Ambulatory Visit: Payer: Self-pay | Admitting: Rheumatology

## 2024-08-27 NOTE — Telephone Encounter (Signed)
 Last Fill: 06/04/2024  Labs: 05/14/2024 CBC and CMP WNL.   TB Gold: 05/14/2024 Neg    Next Visit: 11/05/2024  Last Visit: 05/14/2024  DX: Psoriatic arthritis   Current Dose per office note 05/14/2024: Humira  40 mg sq injections every 14 days.   Patient's wife advised he is due to update labs. Patient will come next week to update.   Okay to refill Humira ?

## 2024-08-30 ENCOUNTER — Other Ambulatory Visit: Payer: Self-pay | Admitting: *Deleted

## 2024-08-30 DIAGNOSIS — Z79899 Other long term (current) drug therapy: Secondary | ICD-10-CM

## 2024-08-31 ENCOUNTER — Ambulatory Visit: Payer: Self-pay | Admitting: Physician Assistant

## 2024-08-31 LAB — CBC WITH DIFFERENTIAL/PLATELET
Absolute Lymphocytes: 1872 {cells}/uL (ref 850–3900)
Absolute Monocytes: 497 {cells}/uL (ref 200–950)
Basophils Absolute: 29 {cells}/uL (ref 0–200)
Basophils Relative: 0.4 %
Eosinophils Absolute: 173 {cells}/uL (ref 15–500)
Eosinophils Relative: 2.4 %
HCT: 44.8 % (ref 39.4–51.1)
Hemoglobin: 15.3 g/dL (ref 13.2–17.1)
MCH: 32.3 pg (ref 27.0–33.0)
MCHC: 34.2 g/dL (ref 31.6–35.4)
MCV: 94.7 fL (ref 81.4–101.7)
MPV: 8.8 fL (ref 7.5–12.5)
Monocytes Relative: 6.9 %
Neutro Abs: 4630 {cells}/uL (ref 1500–7800)
Neutrophils Relative %: 64.3 %
Platelets: 173 Thousand/uL (ref 140–400)
RBC: 4.73 Million/uL (ref 4.20–5.80)
RDW: 12 % (ref 11.0–15.0)
Total Lymphocyte: 26 %
WBC: 7.2 Thousand/uL (ref 3.8–10.8)

## 2024-08-31 LAB — COMPREHENSIVE METABOLIC PANEL WITH GFR
AG Ratio: 1.5 (calc) (ref 1.0–2.5)
ALT: 21 U/L (ref 9–46)
AST: 17 U/L (ref 10–35)
Albumin: 4.1 g/dL (ref 3.6–5.1)
Alkaline phosphatase (APISO): 58 U/L (ref 35–144)
BUN: 20 mg/dL (ref 7–25)
CO2: 28 mmol/L (ref 20–32)
Calcium: 9 mg/dL (ref 8.6–10.3)
Chloride: 104 mmol/L (ref 98–110)
Creat: 1.18 mg/dL (ref 0.70–1.35)
Globulin: 2.7 g/dL (ref 1.9–3.7)
Glucose, Bld: 90 mg/dL (ref 65–99)
Potassium: 4.5 mmol/L (ref 3.5–5.3)
Sodium: 140 mmol/L (ref 135–146)
Total Bilirubin: 0.4 mg/dL (ref 0.2–1.2)
Total Protein: 6.8 g/dL (ref 6.1–8.1)
eGFR: 70 mL/min/1.73m2

## 2024-08-31 NOTE — Progress Notes (Signed)
 CBC and CMP WNL

## 2024-09-13 ENCOUNTER — Other Ambulatory Visit (HOSPITAL_COMMUNITY): Payer: Self-pay

## 2024-09-13 MED ORDER — PRAVASTATIN SODIUM 10 MG PO TABS
10.0000 mg | ORAL_TABLET | Freq: Every day | ORAL | 0 refills | Status: AC
  Filled 2024-09-13: qty 30, 30d supply, fill #0

## 2024-09-17 ENCOUNTER — Other Ambulatory Visit: Payer: Self-pay | Admitting: Physician Assistant

## 2024-09-17 NOTE — Telephone Encounter (Signed)
 Last Fill: 08/27/2024   Labs: 08/30/2024 CBC and CMP WNL   TB Gold: 05/14/2024 TB gold negative   Next Visit: 11/05/2024  Last Visit: 05/14/2024  IK:Ednmpjupr arthritis (HCC)-HLA-B27 negative   Current Dose per office note 05/14/2024: Humira  40 mg sq injections every 14 days.   Okay to refill Humira ?

## 2024-11-05 ENCOUNTER — Ambulatory Visit: Admitting: Rheumatology
